# Patient Record
Sex: Female | Born: 1950 | Race: White | Hispanic: No | Smoking: Never smoker
Health system: Southern US, Community
[De-identification: ages and names within clinical notes are randomized; demographics above are authoritative.]

## PROBLEM LIST (undated history)

## (undated) DIAGNOSIS — N393 Stress incontinence (female) (male): Secondary | ICD-10-CM

## (undated) DIAGNOSIS — N390 Urinary tract infection, site not specified: Secondary | ICD-10-CM

## (undated) DIAGNOSIS — F32A Depression, unspecified: Secondary | ICD-10-CM

## (undated) DIAGNOSIS — B009 Herpesviral infection, unspecified: Secondary | ICD-10-CM

## (undated) DIAGNOSIS — N952 Postmenopausal atrophic vaginitis: Secondary | ICD-10-CM

## (undated) DIAGNOSIS — F419 Anxiety disorder, unspecified: Secondary | ICD-10-CM

## (undated) DIAGNOSIS — R7303 Prediabetes: Secondary | ICD-10-CM

## (undated) DIAGNOSIS — I10 Essential (primary) hypertension: Secondary | ICD-10-CM

## (undated) DIAGNOSIS — K635 Polyp of colon: Secondary | ICD-10-CM

## (undated) DIAGNOSIS — N870 Mild cervical dysplasia: Secondary | ICD-10-CM

## (undated) DIAGNOSIS — B977 Papillomavirus as the cause of diseases classified elsewhere: Secondary | ICD-10-CM

## (undated) HISTORY — DX: Depression, unspecified: F32.A

## (undated) HISTORY — DX: Stress incontinence (female) (male): N39.3

## (undated) HISTORY — PX: TUBAL LIGATION: SHX77

## (undated) HISTORY — DX: Papillomavirus as the cause of diseases classified elsewhere: B97.7

## (undated) HISTORY — DX: Postmenopausal atrophic vaginitis: N95.2

## (undated) HISTORY — PX: OPEN REDUCTION INTERNAL FIXATION (ORIF) DISTAL RADIAL FRACTURE: SHX5989

## (undated) HISTORY — DX: Mild cervical dysplasia: N87.0

## (undated) HISTORY — DX: Prediabetes: R73.03

## (undated) HISTORY — DX: Anxiety disorder, unspecified: F41.9

## (undated) HISTORY — PX: ROTATOR CUFF REPAIR: SHX139

## (undated) HISTORY — PX: WRIST SURGERY: SHX841

## (undated) HISTORY — DX: Urinary tract infection, site not specified: N39.0

## (undated) HISTORY — DX: Essential (primary) hypertension: I10

## (undated) HISTORY — DX: Polyp of colon: K63.5

## (undated) HISTORY — DX: Herpesviral infection, unspecified: B00.9

## (undated) HISTORY — PX: COLPOSCOPY: SHX161

---

## 1998-11-25 ENCOUNTER — Other Ambulatory Visit: Admission: RE | Admit: 1998-11-25 | Discharge: 1998-11-25 | Payer: Self-pay | Admitting: Obstetrics and Gynecology

## 1999-12-25 ENCOUNTER — Other Ambulatory Visit: Admission: RE | Admit: 1999-12-25 | Discharge: 1999-12-25 | Payer: Self-pay | Admitting: Obstetrics and Gynecology

## 2000-05-23 ENCOUNTER — Ambulatory Visit (HOSPITAL_COMMUNITY): Admission: RE | Admit: 2000-05-23 | Discharge: 2000-05-23 | Payer: Self-pay | Admitting: *Deleted

## 2000-06-08 ENCOUNTER — Other Ambulatory Visit: Admission: RE | Admit: 2000-06-08 | Discharge: 2000-06-08 | Payer: Self-pay | Admitting: Radiology

## 2000-12-27 ENCOUNTER — Other Ambulatory Visit: Admission: RE | Admit: 2000-12-27 | Discharge: 2000-12-27 | Payer: Self-pay | Admitting: Obstetrics and Gynecology

## 2001-12-25 ENCOUNTER — Ambulatory Visit (HOSPITAL_BASED_OUTPATIENT_CLINIC_OR_DEPARTMENT_OTHER): Admission: RE | Admit: 2001-12-25 | Discharge: 2001-12-25 | Payer: Self-pay | Admitting: *Deleted

## 2002-01-10 ENCOUNTER — Other Ambulatory Visit: Admission: RE | Admit: 2002-01-10 | Discharge: 2002-01-10 | Payer: Self-pay | Admitting: Obstetrics and Gynecology

## 2002-10-24 ENCOUNTER — Ambulatory Visit (HOSPITAL_BASED_OUTPATIENT_CLINIC_OR_DEPARTMENT_OTHER): Admission: RE | Admit: 2002-10-24 | Discharge: 2002-10-24 | Payer: Self-pay | Admitting: *Deleted

## 2003-01-10 ENCOUNTER — Other Ambulatory Visit: Admission: RE | Admit: 2003-01-10 | Discharge: 2003-01-10 | Payer: Self-pay | Admitting: Obstetrics and Gynecology

## 2004-02-12 ENCOUNTER — Other Ambulatory Visit: Admission: RE | Admit: 2004-02-12 | Discharge: 2004-02-12 | Payer: Self-pay | Admitting: Obstetrics and Gynecology

## 2004-12-22 ENCOUNTER — Ambulatory Visit: Payer: Self-pay | Admitting: Family Medicine

## 2005-02-12 ENCOUNTER — Other Ambulatory Visit: Admission: RE | Admit: 2005-02-12 | Discharge: 2005-02-12 | Payer: Self-pay | Admitting: Addiction Medicine

## 2005-08-26 ENCOUNTER — Ambulatory Visit: Payer: Self-pay | Admitting: Internal Medicine

## 2005-10-08 ENCOUNTER — Ambulatory Visit: Payer: Self-pay | Admitting: Internal Medicine

## 2006-02-14 ENCOUNTER — Other Ambulatory Visit: Admission: RE | Admit: 2006-02-14 | Discharge: 2006-02-14 | Payer: Self-pay | Admitting: Obstetrics and Gynecology

## 2007-02-28 ENCOUNTER — Other Ambulatory Visit: Admission: RE | Admit: 2007-02-28 | Discharge: 2007-02-28 | Payer: Self-pay | Admitting: Obstetrics and Gynecology

## 2007-08-21 ENCOUNTER — Telehealth: Payer: Self-pay | Admitting: Internal Medicine

## 2007-10-04 ENCOUNTER — Encounter: Payer: Self-pay | Admitting: Internal Medicine

## 2007-12-14 HISTORY — PX: OTHER SURGICAL HISTORY: SHX169

## 2008-02-19 ENCOUNTER — Encounter: Payer: Self-pay | Admitting: Internal Medicine

## 2008-03-07 ENCOUNTER — Other Ambulatory Visit: Admission: RE | Admit: 2008-03-07 | Discharge: 2008-03-07 | Payer: Self-pay | Admitting: Obstetrics and Gynecology

## 2008-04-03 ENCOUNTER — Encounter: Payer: Self-pay | Admitting: Internal Medicine

## 2008-04-16 DIAGNOSIS — D71 Functional disorders of polymorphonuclear neutrophils: Secondary | ICD-10-CM | POA: Insufficient documentation

## 2008-08-21 ENCOUNTER — Telehealth (INDEPENDENT_AMBULATORY_CARE_PROVIDER_SITE_OTHER): Payer: Self-pay | Admitting: *Deleted

## 2008-10-09 ENCOUNTER — Encounter: Payer: Self-pay | Admitting: Internal Medicine

## 2008-10-10 ENCOUNTER — Ambulatory Visit: Payer: Self-pay | Admitting: Internal Medicine

## 2008-10-19 LAB — CONVERTED CEMR LAB
ALT: 21 units/L (ref 0–35)
AST: 24 units/L (ref 0–37)
Albumin: 3.8 g/dL (ref 3.5–5.2)
Alkaline Phosphatase: 35 units/L — ABNORMAL LOW (ref 39–117)
BUN: 12 mg/dL (ref 6–23)
Basophils Absolute: 0 10*3/uL (ref 0.0–0.1)
Basophils Relative: 0.5 % (ref 0.0–3.0)
Bilirubin, Direct: 0.1 mg/dL (ref 0.0–0.3)
CO2: 27 meq/L (ref 19–32)
Calcium: 8.5 mg/dL (ref 8.4–10.5)
Chloride: 103 meq/L (ref 96–112)
Cholesterol: 169 mg/dL (ref 0–200)
Creatinine, Ser: 0.9 mg/dL (ref 0.4–1.2)
Eosinophils Absolute: 0.3 10*3/uL (ref 0.0–0.7)
Eosinophils Relative: 5.6 % — ABNORMAL HIGH (ref 0.0–5.0)
GFR calc Af Amer: 82 mL/min
GFR calc non Af Amer: 68 mL/min
Glucose, Bld: 81 mg/dL (ref 70–99)
HCT: 35.3 % — ABNORMAL LOW (ref 36.0–46.0)
HDL: 77.4 mg/dL (ref >39.0)
Hemoglobin: 12.2 g/dL (ref 12.0–15.0)
LDL Cholesterol: 78 mg/dL (ref 0–99)
Lymphocytes Relative: 34.3 % (ref 12.0–46.0)
MCHC: 34.5 g/dL (ref 30.0–36.0)
MCV: 86.4 fL (ref 78.0–100.0)
Monocytes Absolute: 0.5 10*3/uL (ref 0.1–1.0)
Monocytes Relative: 11 % (ref 3.0–12.0)
Neutro Abs: 2.2 10*3/uL (ref 1.4–7.7)
Neutrophils Relative %: 48.6 % (ref 43.0–77.0)
Platelets: 185 10*3/uL (ref 150–400)
Potassium: 3.9 meq/L (ref 3.5–5.1)
RBC: 4.09 M/uL (ref 3.87–5.11)
RDW: 12.8 % (ref 11.5–14.6)
Sodium: 137 meq/L (ref 135–145)
TSH: 1.12 microintl units/mL (ref 0.35–5.50)
Total Bilirubin: 0.8 mg/dL (ref 0.3–1.2)
Total CHOL/HDL Ratio: 2.2
Total Protein: 7.1 g/dL (ref 6.0–8.3)
Triglycerides: 69 mg/dL (ref 0–149)
VLDL: 14 mg/dL (ref 0–40)
WBC: 4.6 10*3/uL (ref 4.5–10.5)

## 2008-10-21 ENCOUNTER — Encounter (INDEPENDENT_AMBULATORY_CARE_PROVIDER_SITE_OTHER): Payer: Self-pay | Admitting: *Deleted

## 2008-10-31 ENCOUNTER — Telehealth (INDEPENDENT_AMBULATORY_CARE_PROVIDER_SITE_OTHER): Payer: Self-pay | Admitting: *Deleted

## 2008-11-28 ENCOUNTER — Ambulatory Visit: Payer: Self-pay | Admitting: Internal Medicine

## 2008-11-29 ENCOUNTER — Encounter: Payer: Self-pay | Admitting: Internal Medicine

## 2008-11-30 LAB — CONVERTED CEMR LAB
Basophils Absolute: 0 10*3/uL (ref 0.0–0.1)
Basophils Relative: 0.3 % (ref 0.0–3.0)
Eosinophils Absolute: 0.3 10*3/uL (ref 0.0–0.7)
Eosinophils Relative: 4.6 % (ref 0.0–5.0)
HCT: 36.3 % (ref 36.0–46.0)
Hemoglobin: 11.9 g/dL — ABNORMAL LOW (ref 12.0–15.0)
Lymphocytes Relative: 38.1 % (ref 12.0–46.0)
MCHC: 32.9 g/dL (ref 30.0–36.0)
MCV: 89 fL (ref 78.0–100.0)
Monocytes Absolute: 0.3 10*3/uL (ref 0.1–1.0)
Monocytes Relative: 5.4 % (ref 3.0–12.0)
Neutro Abs: 2.8 10*3/uL (ref 1.4–7.7)
Neutrophils Relative %: 51.6 % (ref 43.0–77.0)
Platelets: 199 10*3/uL (ref 150–400)
RBC: 4.08 M/uL (ref 3.87–5.11)
RDW: 12.9 % (ref 11.5–14.6)
WBC: 5.5 10*3/uL (ref 4.5–10.5)

## 2008-12-02 ENCOUNTER — Encounter: Payer: Self-pay | Admitting: Internal Medicine

## 2008-12-02 ENCOUNTER — Encounter (INDEPENDENT_AMBULATORY_CARE_PROVIDER_SITE_OTHER): Payer: Self-pay | Admitting: *Deleted

## 2008-12-03 LAB — CONVERTED CEMR LAB: Vit D, 1,25-Dihydroxy: 39 (ref 30–89)

## 2009-03-05 ENCOUNTER — Encounter: Payer: Self-pay | Admitting: Internal Medicine

## 2009-03-19 ENCOUNTER — Ambulatory Visit: Payer: Self-pay | Admitting: Obstetrics and Gynecology

## 2009-03-19 ENCOUNTER — Other Ambulatory Visit: Admission: RE | Admit: 2009-03-19 | Discharge: 2009-03-19 | Payer: Self-pay | Admitting: Obstetrics and Gynecology

## 2009-03-19 ENCOUNTER — Encounter: Payer: Self-pay | Admitting: Obstetrics and Gynecology

## 2009-07-08 ENCOUNTER — Telehealth (INDEPENDENT_AMBULATORY_CARE_PROVIDER_SITE_OTHER): Payer: Self-pay | Admitting: *Deleted

## 2009-07-09 ENCOUNTER — Ambulatory Visit: Payer: Self-pay | Admitting: Internal Medicine

## 2009-07-09 ENCOUNTER — Telehealth (INDEPENDENT_AMBULATORY_CARE_PROVIDER_SITE_OTHER): Payer: Self-pay | Admitting: *Deleted

## 2009-07-09 DIAGNOSIS — Z860100 Personal history of colon polyps, unspecified: Secondary | ICD-10-CM | POA: Insufficient documentation

## 2009-07-09 LAB — CONVERTED CEMR LAB
Basophils Absolute: 0.1 10*3/uL (ref 0.0–0.1)
Basophils Relative: 0.8 % (ref 0.0–3.0)
Eosinophils Absolute: 0.1 10*3/uL (ref 0.0–0.7)
Eosinophils Relative: 1.6 % (ref 0.0–5.0)
HCT: 36.2 % (ref 36.0–46.0)
Hemoglobin: 12.4 g/dL (ref 12.0–15.0)
Lymphocytes Relative: 21.6 % (ref 12.0–46.0)
Lymphs Abs: 1.6 10*3/uL (ref 0.7–4.0)
MCHC: 34.3 g/dL (ref 30.0–36.0)
MCV: 89.9 fL (ref 78.0–100.0)
Monocytes Absolute: 0.7 10*3/uL (ref 0.1–1.0)
Monocytes Relative: 9 % (ref 3.0–12.0)
Neutro Abs: 4.9 10*3/uL (ref 1.4–7.7)
Neutrophils Relative %: 67 % (ref 43.0–77.0)
Platelets: 224 10*3/uL (ref 150.0–400.0)
RBC: 4.03 M/uL (ref 3.87–5.11)
RDW: 12.7 % (ref 11.5–14.6)
WBC: 7.4 10*3/uL (ref 4.5–10.5)

## 2009-07-10 ENCOUNTER — Encounter (INDEPENDENT_AMBULATORY_CARE_PROVIDER_SITE_OTHER): Payer: Self-pay | Admitting: *Deleted

## 2009-08-27 ENCOUNTER — Ambulatory Visit: Payer: Self-pay | Admitting: Obstetrics and Gynecology

## 2009-09-29 ENCOUNTER — Telehealth (INDEPENDENT_AMBULATORY_CARE_PROVIDER_SITE_OTHER): Payer: Self-pay | Admitting: *Deleted

## 2009-09-29 ENCOUNTER — Encounter: Payer: Self-pay | Admitting: Internal Medicine

## 2009-09-29 ENCOUNTER — Ambulatory Visit: Payer: Self-pay | Admitting: Family Medicine

## 2009-09-29 DIAGNOSIS — R0789 Other chest pain: Secondary | ICD-10-CM | POA: Insufficient documentation

## 2009-09-29 LAB — CONVERTED CEMR LAB
CK-MB: 2.1 ng/mL (ref 0.3–4.0)
Relative Index: 1.4 (ref 0.0–2.5)
Total CK: 145 units/L (ref 7–177)
Troponin I: 0.01 ng/mL (ref <0.06)

## 2009-11-05 ENCOUNTER — Telehealth (INDEPENDENT_AMBULATORY_CARE_PROVIDER_SITE_OTHER): Payer: Self-pay | Admitting: *Deleted

## 2009-11-21 ENCOUNTER — Telehealth (INDEPENDENT_AMBULATORY_CARE_PROVIDER_SITE_OTHER): Payer: Self-pay | Admitting: *Deleted

## 2009-11-21 DIAGNOSIS — Z780 Asymptomatic menopausal state: Secondary | ICD-10-CM | POA: Insufficient documentation

## 2009-12-08 ENCOUNTER — Encounter: Payer: Self-pay | Admitting: Internal Medicine

## 2009-12-09 ENCOUNTER — Telehealth: Payer: Self-pay | Admitting: Internal Medicine

## 2009-12-09 ENCOUNTER — Ambulatory Visit: Payer: Self-pay | Admitting: Internal Medicine

## 2009-12-09 DIAGNOSIS — IMO0001 Reserved for inherently not codable concepts without codable children: Secondary | ICD-10-CM | POA: Insufficient documentation

## 2009-12-10 LAB — CONVERTED CEMR LAB
Basophils Absolute: 0.1 10*3/uL (ref 0.0–0.1)
Basophils Relative: 3.3 % — ABNORMAL HIGH (ref 0.0–3.0)
Eosinophils Absolute: 0.1 10*3/uL (ref 0.0–0.7)
Eosinophils Relative: 2.7 % (ref 0.0–5.0)
HCT: 35.7 % — ABNORMAL LOW (ref 36.0–46.0)
Hemoglobin: 11.6 g/dL — ABNORMAL LOW (ref 12.0–15.0)
Lymphocytes Relative: 46.7 % — ABNORMAL HIGH (ref 12.0–46.0)
Lymphs Abs: 2 10*3/uL (ref 0.7–4.0)
MCHC: 32.4 g/dL (ref 30.0–36.0)
MCV: 92.2 fL (ref 78.0–100.0)
Monocytes Absolute: 0.9 10*3/uL (ref 0.1–1.0)
Monocytes Relative: 21.8 % — ABNORMAL HIGH (ref 3.0–12.0)
Neutro Abs: 1.1 10*3/uL — ABNORMAL LOW (ref 1.4–7.7)
Neutrophils Relative %: 25.5 % — ABNORMAL LOW (ref 43.0–77.0)
Platelets: 201 10*3/uL (ref 150.0–400.0)
RBC: 3.87 M/uL (ref 3.87–5.11)
RDW: 12.5 % (ref 11.5–14.6)
WBC: 4.2 10*3/uL — ABNORMAL LOW (ref 4.5–10.5)

## 2009-12-11 ENCOUNTER — Telehealth (INDEPENDENT_AMBULATORY_CARE_PROVIDER_SITE_OTHER): Payer: Self-pay | Admitting: *Deleted

## 2009-12-11 ENCOUNTER — Encounter (INDEPENDENT_AMBULATORY_CARE_PROVIDER_SITE_OTHER): Payer: Self-pay | Admitting: *Deleted

## 2010-01-09 ENCOUNTER — Ambulatory Visit: Payer: Self-pay | Admitting: Internal Medicine

## 2010-01-12 LAB — CONVERTED CEMR LAB
Basophils Absolute: 0 10*3/uL (ref 0.0–0.1)
Basophils Relative: 0.3 % (ref 0.0–3.0)
Eosinophils Absolute: 0.1 10*3/uL (ref 0.0–0.7)
Eosinophils Relative: 1.7 % (ref 0.0–5.0)
Folate: >20 ng/mL
HCT: 35.9 % — ABNORMAL LOW (ref 36.0–46.0)
Hemoglobin: 12.1 g/dL (ref 12.0–15.0)
Iron: 190 ug/dL — ABNORMAL HIGH (ref 42–145)
Lymphocytes Relative: 25.6 % (ref 12.0–46.0)
Lymphs Abs: 1.9 10*3/uL (ref 0.7–4.0)
MCHC: 33.6 g/dL (ref 30.0–36.0)
MCV: 90.9 fL (ref 78.0–100.0)
Monocytes Absolute: 0.6 10*3/uL (ref 0.1–1.0)
Monocytes Relative: 7.8 % (ref 3.0–12.0)
Neutro Abs: 4.7 10*3/uL (ref 1.4–7.7)
Neutrophils Relative %: 64.6 % (ref 43.0–77.0)
Platelets: 208 10*3/uL (ref 150.0–400.0)
RBC: 3.95 M/uL (ref 3.87–5.11)
RDW: 13.3 % (ref 11.5–14.6)
Saturation Ratios: 51.4 % — ABNORMAL HIGH (ref 20.0–50.0)
Transferrin: 264.2 mg/dL (ref 212.0–360.0)
Vitamin B-12: 610 pg/mL (ref 211–911)
WBC: 7.3 10*3/uL (ref 4.5–10.5)

## 2010-01-23 ENCOUNTER — Ambulatory Visit: Payer: Self-pay | Admitting: Obstetrics and Gynecology

## 2010-02-09 ENCOUNTER — Ambulatory Visit: Payer: Self-pay | Admitting: Obstetrics and Gynecology

## 2010-03-11 ENCOUNTER — Encounter: Payer: Self-pay | Admitting: Internal Medicine

## 2010-03-13 ENCOUNTER — Encounter: Payer: Self-pay | Admitting: Family Medicine

## 2010-03-16 ENCOUNTER — Encounter: Payer: Self-pay | Admitting: Internal Medicine

## 2010-04-01 ENCOUNTER — Other Ambulatory Visit: Admission: RE | Admit: 2010-04-01 | Discharge: 2010-04-01 | Payer: Self-pay | Admitting: Obstetrics and Gynecology

## 2010-04-01 ENCOUNTER — Ambulatory Visit: Payer: Self-pay | Admitting: Obstetrics and Gynecology

## 2010-05-13 DIAGNOSIS — B977 Papillomavirus as the cause of diseases classified elsewhere: Secondary | ICD-10-CM

## 2010-05-13 DIAGNOSIS — N870 Mild cervical dysplasia: Secondary | ICD-10-CM

## 2010-05-13 HISTORY — DX: Papillomavirus as the cause of diseases classified elsewhere: B97.7

## 2010-05-13 HISTORY — DX: Mild cervical dysplasia: N87.0

## 2010-05-19 ENCOUNTER — Ambulatory Visit: Payer: Self-pay | Admitting: Obstetrics and Gynecology

## 2010-06-22 ENCOUNTER — Ambulatory Visit: Payer: Self-pay | Admitting: Internal Medicine

## 2010-09-07 ENCOUNTER — Encounter: Payer: Self-pay | Admitting: Internal Medicine

## 2010-09-16 ENCOUNTER — Encounter: Payer: Self-pay | Admitting: Internal Medicine

## 2010-10-05 ENCOUNTER — Ambulatory Visit: Payer: Self-pay | Admitting: Internal Medicine

## 2010-11-02 ENCOUNTER — Telehealth: Payer: Self-pay | Admitting: Internal Medicine

## 2010-11-02 ENCOUNTER — Ambulatory Visit: Payer: Self-pay | Admitting: Internal Medicine

## 2010-11-19 ENCOUNTER — Ambulatory Visit: Payer: Self-pay | Admitting: Obstetrics and Gynecology

## 2010-11-19 ENCOUNTER — Other Ambulatory Visit
Admission: RE | Admit: 2010-11-19 | Discharge: 2010-11-19 | Payer: Self-pay | Source: Home / Self Care | Admitting: Obstetrics and Gynecology

## 2010-12-02 ENCOUNTER — Ambulatory Visit: Payer: Self-pay | Admitting: Internal Medicine

## 2010-12-02 DIAGNOSIS — M2550 Pain in unspecified joint: Secondary | ICD-10-CM | POA: Insufficient documentation

## 2010-12-02 DIAGNOSIS — G479 Sleep disorder, unspecified: Secondary | ICD-10-CM | POA: Insufficient documentation

## 2010-12-03 ENCOUNTER — Telehealth: Payer: Self-pay | Admitting: Internal Medicine

## 2010-12-09 ENCOUNTER — Encounter: Payer: Self-pay | Admitting: Internal Medicine

## 2010-12-09 ENCOUNTER — Telehealth: Payer: Self-pay | Admitting: Internal Medicine

## 2010-12-09 ENCOUNTER — Ambulatory Visit
Admission: RE | Admit: 2010-12-09 | Discharge: 2010-12-09 | Payer: Self-pay | Source: Home / Self Care | Attending: Internal Medicine | Admitting: Internal Medicine

## 2010-12-09 ENCOUNTER — Ambulatory Visit: Payer: Self-pay | Admitting: Internal Medicine

## 2010-12-10 LAB — CONVERTED CEMR LAB
ALT: 17 units/L (ref 0–35)
AST: 22 units/L (ref 0–37)
Albumin: 3.5 g/dL (ref 3.5–5.2)
Alkaline Phosphatase: 46 units/L (ref 39–117)
BUN: 13 mg/dL (ref 6–23)
Basophils Absolute: 0 10*3/uL (ref 0.0–0.1)
Basophils Relative: 0.9 % (ref 0.0–3.0)
Bilirubin, Direct: 0 mg/dL (ref 0.0–0.3)
CO2: 27 meq/L (ref 19–32)
CRP, High Sensitivity: 6.93 — ABNORMAL HIGH (ref 0.00–5.00)
Calcium: 8.7 mg/dL (ref 8.4–10.5)
Chloride: 103 meq/L (ref 96–112)
Cholesterol: 164 mg/dL (ref 0–200)
Creatinine, Ser: 0.9 mg/dL (ref 0.4–1.2)
Eosinophils Absolute: 0.4 10*3/uL (ref 0.0–0.7)
Eosinophils Relative: 7.8 % — ABNORMAL HIGH (ref 0.0–5.0)
GFR calc non Af Amer: 69.34 mL/min (ref >60.00)
Glucose, Bld: 83 mg/dL (ref 70–99)
HCT: 35.4 % — ABNORMAL LOW (ref 36.0–46.0)
HDL: 70.6 mg/dL (ref >39.00)
Hemoglobin: 11.9 g/dL — ABNORMAL LOW (ref 12.0–15.0)
LDL Cholesterol: 82 mg/dL (ref 0–99)
Lymphocytes Relative: 30.6 % (ref 12.0–46.0)
Lymphs Abs: 1.7 10*3/uL (ref 0.7–4.0)
MCHC: 33.7 g/dL (ref 30.0–36.0)
MCV: 91.2 fL (ref 78.0–100.0)
Monocytes Absolute: 0.7 10*3/uL (ref 0.1–1.0)
Monocytes Relative: 11.9 % (ref 3.0–12.0)
Neutro Abs: 2.7 10*3/uL (ref 1.4–7.7)
Neutrophils Relative %: 48.8 % (ref 43.0–77.0)
Platelets: 221 10*3/uL (ref 150.0–400.0)
Potassium: 3.9 meq/L (ref 3.5–5.1)
RBC: 3.89 M/uL (ref 3.87–5.11)
RDW: 13.6 % (ref 11.5–14.6)
Sed Rate: 12 mm/hr (ref 0–22)
Sodium: 137 meq/L (ref 135–145)
TSH: 1.29 microintl units/mL (ref 0.35–5.50)
Total Bilirubin: 0.2 mg/dL — ABNORMAL LOW (ref 0.3–1.2)
Total CHOL/HDL Ratio: 2
Total Protein: 7 g/dL (ref 6.0–8.3)
Triglycerides: 59 mg/dL (ref 0.0–149.0)
VLDL: 11.8 mg/dL (ref 0.0–40.0)
Vit D, 25-Hydroxy: 59 ng/mL (ref 30–89)
WBC: 5.4 10*3/uL (ref 4.5–10.5)

## 2011-01-12 NOTE — Progress Notes (Signed)
Summary: TRIAGE MESSAGE-RE RECENT LABS  Phone Note Call from Patient   Caller: Patient Summary of Call: MESSAGE LEFT ON VOICEMAIL-PATIENT RECIEVED LABS AND NOTICED THAT IT SAID SOMETHING ABOUT LOW RBC BUT IT WAS ONLY ABOUT 7% LOWER THAN REF RANGE. PATIENT WAS INSTRUCTED TO RECHECK-APPOINTMENT CARD WAS MAILED. PATIENT STATED THAT SHE DOESNT EAT ANY RED MEATS AND WOULD LIKE TO KNOW IF THATS RELATED TO ABNORMAL RBC AND IF SO SHOULD SHE STILL RECHECK?  [facility] PLEASE ADVISE  Rachel Shepherd  October 31, 2008 11:45 AM   Follow-up for Phone Call        this is not significant ; if she is concerned a CBC & iron panel can be repeated Follow-up by: Marga Melnick MD,  November 01, 2008 6:49 AM  Additional Follow-up for Phone Call Additional follow up Details #1::        SPOKE WITH PATIENT, PATIENT OK'D DR.HOPPER;S RESPONSE. PATIENT HAS PENDING APPOINTMENT TO RECHECK LABS. PATIENT SAID SHE IS ABOUT TO GO ON VACATION X 2 WEEKS AND WILL COMPLETE STOOL CARDS WHEN SHE RETURNS. NOTED.  Additional Follow-up by: Shonna Chock,  November 01, 2008 11:38 AM

## 2011-01-12 NOTE — Assessment & Plan Note (Signed)
Summary: FLU SHOT///SPH  Nurse Visit  CC: Flu shot./kb   Allergies: 1)  ! Amoxicillin 2)  ! Clindamycin 3)  ! Doxycycline  Orders Added: 1)  Admin 1st Vaccine [90471] 2)  Flu Vaccine 36yrs + [57846]           Flu Vaccine Consent Questions     Do you have a history of severe allergic reactions to this vaccine? no    Any prior history of allergic reactions to egg and/or gelatin? no    Do you have a sensitivity to the preservative Thimersol? no    Do you have a past history of Guillan-Barre Syndrome? no    Do you currently have an acute febrile illness? no    Have you ever had a severe reaction to latex? no    Vaccine information given and explained to patient? yes    Are you currently pregnant? no    Lot Number:[id number]   Exp Date:06/12/2011   Site Given  Left  Deltoid IM

## 2011-01-12 NOTE — Progress Notes (Signed)
Summary: Alternative rx  Phone Note Refill Request Message from:  Pharmacy on November 02, 2010 2:36 PM  Refills Requested: Medication #1:  CODEINE SULFATE 15 MG TABS 1 pill every 6 hrs as needed for cough. [facility] [address]. This med is not available and they do not know when they can get it, requesting alternative. Please advise.  Initial call taken by: Lucious Groves CMA,  November 02, 2010 2:36 PM  Follow-up for Phone Call        Spoke with pharmacy, and 30mg  is not available either. They offered to order it for the patient and per pharmacy she did not seem too happy about that.  Whil I held the pharmacy called her and the patient will be leaving town tomorrow. Per MD the patient was given Liquid Tylenol with Codeine Sig: 1 tsp q 6h as needed severe cough 120cc no refills. Follow-up by: Lucious Groves CMA,  November 02, 2010 3:31 PM    New/Updated Medications: TYLENOL WITH CODEINE #3 300-30 MG TABS (ACETAMINOPHEN-CODEINE) 1 tsp q 6h as needed severe cough Prescriptions: TYLENOL WITH CODEINE #3 300-30 MG TABS (ACETAMINOPHEN-CODEINE) 1 tsp q 6h as needed severe cough  #120cc x 0   Entered by:   Lucious Groves CMA   Authorized by:   Marga Melnick MD   Signed by:   Lucious Groves CMA on 11/02/2010   Method used:   Historical   RxID:   [id number]

## 2011-01-12 NOTE — Progress Notes (Signed)
Summary: TRIAGE CALL: BLOOD IN STOOL  Phone Note Call from Patient Call back at [phone]   Caller: Patient Summary of Call: PATIENT CALLED WITH BLOOD IN STOOL, NOTICED TODAY WITH BM. PATIENT NOT WITH ANY OTHER SYMPTOMS. PATIENT UNABLE TO COME IN AT THE TIME OF CALL. PATIENT  SCHEDULED APPOINTMENT TO COME IN TOMORROW AT 9:00AM AND EMERGENCY ROOM IF ANY CHANGE IN SYMPTOMS. PATIENT OK'D  DR.HOPPER WAS INFORMED OF THIS CALL AND OK'D INSTRUCTION FOR EMERGENCY ROOM IF ANY CHANGE IN SYMPTOMS   Rachel Shepherd  July 08, 2009 3:24 PM

## 2011-01-12 NOTE — Letter (Signed)
Summary: Results Follow up Letter  [facility]  [address] [place], [place] [address]   Phone: [phone]  Fax: [phone]    10/21/2008 MRN: [id number]  Rachel Shepherd [address] [place], [place]  [address]  Dear [name],  The following are the results of your recent test(s):  Test         Result    Pap Smear:        Normal _____  Not Normal _____ Comments: ______________________________________________________ Cholesterol: LDL(Bad cholesterol):         Your goal is less than:         HDL (Good cholesterol):       Your goal is more than: Comments:  ______________________________________________________ Mammogram:        Normal _____  Not Normal _____ Comments:  ___________________________________________________________________ Hemoccult:        Normal _____  Not normal _______ Comments:    _____________________________________________________________________ Other Tests: Please see attached labs done on 10/10/2008, see attached appointment card to recheck labs in 8-12 weeks. If appointment date/time interfers with your scheduled please call to reschedule.    We routinely do not discuss normal results over the telephone.  If you desire a copy of the results, or you have any questions about this information we can discuss them at your next office visit.   Sincerely,

## 2011-01-12 NOTE — Letter (Signed)
Summary: Results Follow up Letter  [facility]  [address] [place], [place] [address]   Phone: [phone]  Fax: [phone]    07/10/2009 MRN: [id number]  Rachel Shepherd [address] [place], [place]  [address]  Dear Ms. Prosch,  The following are the results of your recent test(s):  Test         Result    Pap Smear:        Normal _____  Not Normal _____ Comments: ______________________________________________________ Cholesterol: LDL(Bad cholesterol):         Your goal is less than:         HDL (Good cholesterol):       Your goal is more than: Comments:  ______________________________________________________ Mammogram:        Normal _____  Not Normal _____ Comments:  ___________________________________________________________________ Hemoccult:        Normal _____  Not normal _______ Comments:    _____________________________________________________________________ Other Tests: PLEASE SEE ATTACHED LABS DONE ON 07/09/2009    We routinely do not discuss normal results over the telephone.  If you desire a copy of the results, or you have any questions about this information we can discuss them at your next office visit.   Sincerely,

## 2011-01-12 NOTE — Assessment & Plan Note (Signed)
Summary: DRY COUGH//PH   Vital Signs:  Patient profile:   60 year old female Height:      63.5 inches (161.29 cm) Weight:      124 pounds (56.36 kg) BMI:     21.70 O2 Sat:      98 % on Room air Temp:     99.2 degrees F (37.33 degrees C) oral Pulse rate:   72 / minute Resp:     14 per minute BP sitting:   120 / 84  (left arm) Cuff size:   regular  Vitals Entered By: Lucious Groves CMA (November 02, 2010 12:23 PM)  O2 Flow:  Room air CC: C/O dry cough x10 days./kb, URI symptoms Is Patient Diabetic? No Pain Assessment Patient in pain? no      Comments Patient notes that she has had the cough x10 days and had a dry cough last year that turned out to be bronchitis. She has has mucous production 4x and brown in color and sore throat. Patient notes that she has not had fever, SOB, or HA./kb   CC:  C/O dry cough x10 days./kb and URI symptoms.  History of Present Illness:  RTI  Symptoms      This is a 60 year old woman who presents with   symptoms since 11/12; onset as ST & then laryngitis.  The patient  now reports sore throat and minimally  productive cough with scant  brown sputum, but denies nasal congestion, purulent nasal discharge, and earache.  Associated symptoms include low-grade fever (<100.5 degrees).  The patient denies dyspnea and wheezing.  The patient denies headache &  bilateral facial pain, tooth pain, and tender adenopathy.  Rx: Mucinex, cold preps  Current Medications (verified): 1)  Calcium 1000 + D 1000-800 Mg-Unit Tabs (Calcium Carb-Cholecalciferol) .Marland Kitchen.. 1 By Mouth Qd 2)  Biotin 5000 5 Mg Caps (Biotin) .Marland Kitchen.. 1 By Mouth Once Daily 3)  Coq10 100 Mg Caps (Coenzyme Q10) .... 300mg  Once Daily 4)  Fish Oil (360mg  Epa-240mg  Dha) .Marland Kitchen.. 1 By Mouth Once Daily 5)  Vitamin C 1000 Mg Tabs (Ascorbic Acid) .Marland Kitchen.. 1 By Mouth Once Daily 6)  Estrace 0.5 Mg Tabs (Estradiol) .Marland Kitchen.. 1 By Mouth Once Daily 7)  Activella 1-0.5 Mg Tabs (Estradiol-Norethindrone Acet) .Marland Kitchen.. 1 By Mouth Once  Daily 8)  Flora Smart .Marland Kitchen.. 1 By Mouth Once Daily 9)  Vitamin D3 2000 Unit Tabs (Cholecalciferol) .Marland Kitchen.. 1 By Mouth Qd 10)  Vitmain B 12 .Marland Kitchen.. 1 By Mouth Qd 11)  Acidophilus  Caps (Lactobacillus) .... Three Times A Day  Allergies (verified): 1)  ! Amoxicillin 2)  ! Clindamycin 3)  ! Doxycycline  Physical Exam  General:  in no acute distress; alert,appropriate and cooperative throughout examination Ears:  External ear exam shows no significant lesions or deformities.  Otoscopic examination reveals clear canals, tympanic membranes are intact bilaterally without bulging, retraction, inflammation or discharge. Hearing is grossly normal bilaterally. Nose:  External nasal examination shows no deformity or inflammation. Nasal mucosa are pink and moist without lesions or exudates. Mouth:  Oral mucosa and oropharynx without lesions or exudates.  Teeth in good repair. Lungs:  Normal respiratory effort, chest expands symmetrically. Lungs are clear to auscultation, no crackles or wheezes. Cervical Nodes:  No lymphadenopathy noted Axillary Nodes:  No palpable lymphadenopathy   Impression & Recommendations:  Problem # 1:  BRONCHITIS-ACUTE (ICD-466.0)  The following medications were removed from the medication list:    Doxycycline Hyclate 100 Mg Caps (Doxycycline hyclate) .Marland KitchenMarland KitchenMarland KitchenMarland Kitchen  1 two times a day x 5 days then 1 once daily Her updated medication list for this problem includes:    Smz-tmp Ds 800-160 Mg Tabs (Sulfamethoxazole-trimethoprim) .Marland Kitchen... 1 two times a day with 8 oz water  Complete Medication List: 1)  Calcium 1000 + D 1000-800 Mg-unit Tabs (Calcium carb-cholecalciferol) .Marland Kitchen.. 1 by mouth qd 2)  Biotin 5000 5 Mg Caps (Biotin) .Marland Kitchen.. 1 by mouth once daily 3)  Coq10 100 Mg Caps (Coenzyme q10) .... 300mg  once daily 4)  Fish Oil (360mg  Epa-240mg  Dha)  .Marland Kitchen.. 1 by mouth once daily 5)  Vitamin C 1000 Mg Tabs (Ascorbic acid) .Marland Kitchen.. 1 by mouth once daily 6)  Estrace 0.5 Mg Tabs (Estradiol) .Marland Kitchen.. 1 by mouth once  daily 7)  Activella 1-0.5 Mg Tabs (Estradiol-norethindrone acet) .Marland Kitchen.. 1 by mouth once daily 8)  [employer]  .Marland KitchenMarland Kitchen. 1 by mouth once daily 9)  Vitamin D3 2000 Unit Tabs (Cholecalciferol) .Marland Kitchen.. 1 by mouth qd 10)  Vitmain B 12  .Marland KitchenMarland Kitchen. 1 by mouth qd 11)  Acidophilus Caps (Lactobacillus) .... Three times a day 12)  Smz-tmp Ds 800-160 Mg Tabs (Sulfamethoxazole-trimethoprim) .Marland Kitchen.. 1 two times a day with 8 oz water 13)  Codeine Sulfate 15 Mg Tabs (Codeine sulfate) .Marland Kitchen.. 1 pill every 6 hrs as needed for cough  Patient Instructions: 1)  Drink as much  NON dairy fluid as you can tolerate for the next few days. Prescriptions: CODEINE SULFATE 15 MG TABS (CODEINE SULFATE) 1 pill every 6 hrs as needed for cough  #20 x 0   Entered and Authorized by:   Marga Melnick MD   Signed by:   Marga Melnick MD on 11/02/2010   Method used:   Print then Give to Patient   RxID:   [id number] SMZ-TMP DS 800-160 MG TABS (SULFAMETHOXAZOLE-TRIMETHOPRIM) 1 two times a day with 8 oz water  #14 x 0   Entered and Authorized by:   Marga Melnick MD   Signed by:   Marga Melnick MD on 11/02/2010   Method used:   Print then Give to Patient   RxID:   [id number]    Orders Added: 1)  Est. Patient Level III [40102]

## 2011-01-12 NOTE — Assessment & Plan Note (Signed)
Summary: schingles vac/kn  Nurse Visit   Allergies: 1)  ! Amoxicillin 2)  ! Clindamycin 3)  ! Doxycycline  Immunizations Administered:  Zostavax # 1:    Vaccine Type: Zostavax    Site: Left Arm    Mfr: Merck    Dose: 0.65.mL    Route: [place]ers    Given by: Shonna Chock    Exp. Date: 07/08/2011    Lot #: [id number]    VIS given: 09/24/05 given June 22, 2010.  Orders Added: 1)  Zoster (Shingles) Vaccine Live [90736] 2)  Admin 1st Vaccine [phone]

## 2011-01-12 NOTE — Progress Notes (Signed)
      Influenza Immunization History:    Influenza # 1:  Fluvax Non-MCR (08/15/2008) GIVEN @ [facility]./Chrae Malloy  August 21, 2008 3:28 PM

## 2011-01-12 NOTE — Progress Notes (Signed)
Summary: referral bone densit- dr Jackeline Gutknecht  Phone Note Call from Patient Call back at Home Phone [phone] Call back at [phone]   Caller: Patient Summary of Call: need referral for bone density -bone density discussed at last ov -  which is scheduled [id number] - fax to [facility]  Initial call taken by: Okey Regal [place],  August 21, 2007 10:43 AM  Follow-up for Phone Call        last office visit 08/26/05 ..................................................................Marland KitchenShary Decamp  August 21, 2007 11:05 AM   Additional Follow-up for Phone Call Additional follow up Details #1::        schedule BMD & request F/u after results back Additional Follow-up by: Marga Melnick MD,  August 21, 2007 6:15 PM

## 2011-01-12 NOTE — Letter (Signed)
Summary: Results Follow up Letter  [facility]  [address] [place], [place] [address]   Phone: [phone]  Fax: [phone]    12/02/2008 MRN: [id number]  NURAH PETRIDES [address] [place], [place]  [address]  Dear Ms. Sumida,  The following are the results of your recent test(s):  Test         Result    Pap Smear:        Normal _____  Not Normal _____ Comments: ______________________________________________________ Cholesterol: LDL(Bad cholesterol):         Your goal is less than:         HDL (Good cholesterol):       Your goal is more than: Comments:  ______________________________________________________ Mammogram:        Normal _____  Not Normal _____ Comments:  ___________________________________________________________________ Hemoccult:        Normal _____  Not normal _______ Comments:    _____________________________________________________________________ Other Tests: Please see attached labs done on 11/29/2008    We routinely do not discuss normal results over the telephone.  If you desire a copy of the results, or you have any questions about this information we can discuss them at your next office visit.   Sincerely,

## 2011-01-12 NOTE — Progress Notes (Signed)
Summary: chest tightness   Phone Note Outgoing Call Call back at [phone]   Call placed by: Doristine Devoid,  September 29, 2009 1:25 PM Call placed to: Patient Summary of Call: call patient home number left msg on machine found alt. # which was cell patient says she isn't having chest pain just doesn't know how to described symptoms started sat. on and off informed patient that if symptoms get worse before appt will need to go to emergency room patient agreed.

## 2011-01-12 NOTE — Assessment & Plan Note (Signed)
Summary: COUGH,RUNNY NOSE/RH.....   Vital Signs:  Patient profile:   60 year old female Weight:      125.6 pounds Temp:     99.3 degrees F oral Pulse rate:   70 / minute Resp:     14 per minute BP sitting:   124 / 80  (left arm) Cuff size:   regular  Vitals Entered By: Shonna Chock (December 09, 2009 3:01 PM) CC: Cough and runny nose x 1 week. Patient would like to know if its ok for her to get shingles vaccine Comments REVIEWED MED LIST, PATIENT AGREED DOSE AND INSTRUCTION CORRECT    CC:  Cough and runny nose x 1 week. Patient would like to know if its ok for her to get shingles vaccine.  History of Present Illness: One week "semi" ST, fatigue , & myalgias. Eyes twitching & vision blurred. Rx: rest, fluids, & Theraflu with some benefit. She has  had flu shot.  Allergies: 1)  ! Amoxicillin 2)  ! Clindamycin  Review of Systems General:  Complains of sweats; denies chills and fever. Eyes:  Denies discharge, eye pain, red eye, and vision loss-both eyes. ENT:  Complains of nasal congestion and sinus pressure; No frontal headache, facial pain or purulence. Resp:  Complains of cough and sputum productive; denies shortness of breath and wheezing; Clear sputum. MS:  Complains of muscle aches; denies joint pain, joint redness, and joint swelling.  Physical Exam  General:  in no acute distress; alert,appropriate and cooperative throughout examination Ears:  External ear exam shows no significant lesions or deformities.  Otoscopic examination reveals clear canals, tympanic membranes are intact bilaterally without bulging, retraction, inflammation or discharge. Hearing is grossly normal bilaterally. Nose:  External nasal examination shows no deformity or inflammation. Nasal mucosa are  dry & erythematous without lesions or exudates. Mouth:  Oral mucosa and oropharynx without lesions or exudates.  Teeth in good repair. Lungs:  Normal respiratory effort, chest expands symmetrically. Lungs  are clear to auscultation, no crackles or wheezes. Cervical Nodes:  No lymphadenopathy noted but tender Axillary Nodes:  No palpable lymphadenopathy   Impression & Recommendations:  Problem # 1:  BRONCHITIS-ACUTE (ICD-466.0)  Her updated medication list for this problem includes:    Doxycycline Hyclate 100 Mg Caps (Doxycycline hyclate) .Marland Kitchen... 1 two times a day x 5 days then 1 once daily  Orders: Venipuncture (52841) TLB-CBC Platelet - w/Differential (85025-CBCD)  Problem # 2:  URI (ICD-465.9)  Problem # 3:  MUSCLE PAIN (ICD-729.1)  Problem # 4:  FATIGUE (ICD-780.79)  Orders: Venipuncture (32440) TLB-CBC Platelet - w/Differential (85025-CBCD)  Complete Medication List: 1)  Calcium-carb 600 + D 600-125 Mg-unit Tabs (Calcium-vitamin d) .Marland Kitchen.. 1 by mouth once daily 2)  Biotin 5000 5 Mg Caps (Biotin) .Marland Kitchen.. 1 by mouth once daily 3)  Coq10 100 Mg Caps (Coenzyme q10) .... 300mg  once daily 4)  Fish Oil (360mg  Epa-240mg  Dha)  .Marland Kitchen.. 1 by mouth once daily 5)  Vitamin C 1000 Mg Tabs (Ascorbic acid) .Marland Kitchen.. 1 by mouth once daily 6)  Estrace 0.5 Mg Tabs (Estradiol) .Marland Kitchen.. 1 by mouth once daily 7)  Activella 1-0.5 Mg Tabs (Estradiol-norethindrone acet) .Marland Kitchen.. 1 by mouth once daily 8)  Vitamin E 200 Unit Caps (Vitamin e) .Marland Kitchen.. 1 by mouth once daily 9)  Flora Smart  .Marland KitchenMarland Kitchen. 1 by mouth once daily 10)  Doxycycline Hyclate 100 Mg Caps (Doxycycline hyclate) .Marland Kitchen.. 1 two times a day x 5 days then 1 once daily  Patient Instructions: 1)  Neti pot once daily as needed for congestion. Zinc lozenges OR Zicam as needed. Vitamin C 2000 mg once daily . Echinea once daily X 5-7 days.  Prescriptions: DOXYCYCLINE HYCLATE 100 MG CAPS (DOXYCYCLINE HYCLATE) 1 two times a day X 5 days then 1 once daily  #15 x 0   Entered and Authorized by:   Marga Melnick MD   Signed by:   Marga Melnick MD on 12/09/2009   Method used:   Faxed to ...       [id number]       [address]       [place], [place]  [address]        Ph: [phone] or [phone]       Fax: [phone]   RxID:   [id number]

## 2011-01-12 NOTE — Progress Notes (Signed)
Summary: Lab Results  Phone Note Outgoing Call Call back at [facility] Phone [phone]   Call placed by: Shonna Chock,  December 11, 2009 9:55 AM Call placed to: Patient Summary of Call: Left message on machine for patient to return call when avaliable, Reason for call:   Low white count & types of cells suggest recent viral process rather than an atypical bacterial process . Hold Doxycycline unless yellow or green  secretions present. Recheck CBC & dif in 1 month with iron panel, B12 & folic acid due to mild anemia(285.9). Report any change or progression in symptoms. [facility]  Chrae Malloy  December 11, 2009 4:17 PM   Follow-up for Phone Call        Spoke with patient: Medication caused patient to have a rash on stomach and chest and its a little itching. Patient also said her eyes are not right- not seeing up close as well, patient will see eye Dr around the first of the year.  Patient aware I will not on her allergy list about the ABX and she can d/c med at this time.  I faxed patient her labs @ [phone] Follow-up by: Shonna Chock,  December 11, 2009 4:57 PM   New Allergies: ! DOXYCYCLINE New Allergies: ! DOXYCYCLINE

## 2011-01-12 NOTE — Letter (Signed)
Summary: Results Follow up Letter  [facility]  [address] [place], [place] [address]   Phone: [phone]  Fax: [phone]    12/11/2009 MRN: [id number]  Rachel Shepherd [address] [place], [place]  [address]  Dear [name],  The following are the results of your recent test(s):  Test         Result    Pap Smear:        Normal _____  Not Normal _____ Comments: ______________________________________________________ Cholesterol: LDL(Bad cholesterol):         Your goal is less than:         HDL (Good cholesterol):       Your goal is more than: Comments:  ______________________________________________________ Mammogram:        Normal _____  Not Normal _____ Comments:  ___________________________________________________________________ Hemoccult:        Normal _____  Not normal _______ Comments:    _____________________________________________________________________ Other Tests: Please see attached labs done on 12/09/2009    We routinely do not discuss normal results over the telephone.  If you desire a copy of the results, or you have any questions about this information we can discuss them at your next office visit.   Sincerely,

## 2011-01-14 NOTE — Progress Notes (Signed)
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 This may be from the shot or due to the extensive weight training yesterday. If not better in a few days patient can be seen at [facility] clinic or follow up with [facility] next week. Patient expressed understanding. Follow-up by: Lucious Groves CMA,  December 03, 2010 3:37 PM  Additional Follow-up for Phone Call Additional follow up Details #1::        she should avoid weight work until soreness resolves Additional Follow-up by: Marga Melnick MD,  December 03, 2010 4:39 PM    Additional Follow-up for Phone Call Additional follow up Details #2::    Patient was aware. Lucious Groves CMA  December 03, 2010 4:48 PM

## 2011-01-14 NOTE — Progress Notes (Signed)
Summary: CXR and chest pain  Phone Note Call from Patient Call back at Home Phone [phone] Call back at [phone]   Summary of Call: Patient called to notify that she went for CXR this AM, but has been having chest pain on/off x1 week. She notes that yesterday the pain was in the center of her sternum. Patient thinks that it is possibly stress or gas related. Patient just wanted to let MD know that way her results could be reviewed ASAP. Initial call taken by: Lucious Groves CMA,  December 09, 2010 9:28 AM  Follow-up for Phone Call        Chest film reveals old scarring from remote infection; NO acute or new changes seen. Copy wil be mailed Follow-up by: Marga Melnick MD,  December 09, 2010 2:23 PM  Additional Follow-up for Phone Call Additional follow up Details #1::        Patient notified of the above and pt is aware that if chest pain continues to go to ER for eval and possible repeat EKG . Lucious Groves CMA  December 09, 2010 2:52 PM

## 2011-01-14 NOTE — Assessment & Plan Note (Signed)
Summary: CPX//PH   Vital Signs:  Patient profile:   60 year old female Height:      63 inches Weight:      126 pounds BMI:     22.40 Temp:     97.9 degrees F oral Pulse rate:   76 / minute Resp:     14 per minute BP sitting:   130 / 76  (left arm) Cuff size:   regular  Vitals Entered By: Shonna Chock CMA (December 02, 2010 2:22 PM) CC: CPX , Insomnia   CC:  CPX  and Insomnia.  History of Present Illness:     Rachel Shepherd is here for a physical;she has had some sleep disruption over past year.  The patient reports early awakening 2-3 am , but denies difficulty falling asleep, nightmares, leg movements, snoring, apnea noted by partner, and daytime somnolence. This has not caused any significant impact on function .   Preventive Screening-Counseling & Management  Alcohol-Tobacco     Smoking Status: never  Current Medications (verified): 1)  Calcium 1000 + D 1000-800 Mg-Unit Tabs (Calcium Carb-Cholecalciferol) .Marland Kitchen.. 1 By Mouth Qd 2)  Biotin 5000 5 Mg Caps (Biotin) .Marland Kitchen.. 1 By Mouth Once Daily 3)  Coq10 100 Mg Caps (Coenzyme Q10) .... 300mg  Once Daily 4)  Fish Oil (360mg  Epa-240mg  Dha) .Marland Kitchen.. 1 By Mouth Once Daily 5)  Vitamin C 1000 Mg Tabs (Ascorbic Acid) .Marland Kitchen.. 1 By Mouth Once Daily 6)  Estrace 0.5 Mg Tabs (Estradiol) .Marland Kitchen.. 1 By Mouth Once Daily 7)  Activella 1-0.5 Mg Tabs (Estradiol-Norethindrone Acet) .Marland Kitchen.. 1 By Mouth Once Daily 8)  Vitamin D3 2000 Unit Tabs (Cholecalciferol) .Marland Kitchen.. 1 By Mouth Qd 9)  Vitmain B 12 .Marland Kitchen.. 1 By Mouth Qd 10)  Acidophilus  Caps (Lactobacillus) .... Three Times A Day 11)  Enzymes .... Daily  Allergies: 1)  ! Amoxicillin 2)  ! Clindamycin 3)  ! 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.1) as per CT chest scan  in 2001 Minimal Anisocoria Colonic polyps,PMH  of, Dr Kinnie Scales  Past Surgical History: Tubal ligation, Dr Eda Paschal WRIST FRACTURE  2003,S/P plate removal  in 2004 Epidermoid inclusion cystectomy  of face ; Colonoscopy 2004 negative , Dr Kinnie Scales Colon polypectomy 12/10/2009, due 2015  Family History: Mother: asthma, breast cancer PGM: CVA; Father:d MVA @42 ; P aunt : breast cancer  Social History: Never Smoked Alcohol use-yes: socially Regular exercise-yes: CVE, yoga & weights 5 days/ week Occupation: Chief Financial Officer / [employer]  Review of Systems       Weight up 5#. Bronchitis X 2 in past year MS:  Complains of joint pain; denies joint redness, joint swelling, muscle aches, and muscle weakness.  Physical Exam  General:  well-nourished,alert,appropriate and cooperative throughout examination Head:  Normocephalic and atraumatic without obvious abnormalities. Eyes:  No corneal or conjunctival inflammation noted.Perrla. Funduscopic exam benign, without hemorrhages, exudates or papilledema. Ears:  External ear exam shows no significant lesions or deformities.  Otoscopic examination reveals clear canals, tympanic membranes are intact bilaterally without bulging, retraction, inflammation or discharge. Hearing is grossly normal bilaterally. Nose:  External nasal examination shows no deformity or inflammation. Nasal mucosa are pink and moist without lesions or exudates. Mouth:  Oral mucosa and oropharynx without lesions or exudates.  Teeth in good repair. Neck:  No deformities, masses, or tenderness noted. Lungs:  Normal respiratory effort, chest expands symmetrically. Lungs are clear to auscultation, no crackles or wheezes. Heart:  Normal rate and regular rhythm. S1 and  S2 normal without gallop, murmur, click, rub.S4  Abdomen:  Bowel sounds positive,abdomen soft and non-tender without masses, organomegaly or hernias noted. Aorta palpable w/o AAA Genitalia:  Dr Eda Paschal Msk:  No deformity or scoliosis noted of thoracic or lumbar spine.   Pulses:  R and L carotid,radial,dorsalis pedis and posterior tibial pulses are full and equal bilaterally Extremities:  No clubbing, cyanosis, edema, or  deformity noted with normal full range of motion of all joints.   Neurologic:  alert & oriented X3 and DTRs symmetrical and normal.   Skin:  Intact without suspicious lesions or rashes Cervical Nodes:  No lymphadenopathy noted Axillary Nodes:  No palpable lymphadenopathy Psych:  memory intact for recent and remote, normally interactive, and good eye contact.     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 Sleep hygiene discussed. Intervention if symptoms progress.  Complete Medication List: 1)  Calcium 1000 + D 1000-800 Mg-unit Tabs (Calcium carb-cholecalciferol) .Marland Kitchen.. 1 by mouth qd 2)  Biotin 5000 5 Mg Caps (Biotin) .Marland Kitchen.. 1 by mouth once daily 3)  Coq10 100 Mg Caps (Coenzyme q10) .... 300mg  once daily 4)  Fish Oil (360mg  Epa-240mg  Dha)  .Marland Kitchen.. 1 by mouth once daily 5)  Vitamin C 1000 Mg Tabs (Ascorbic acid) .Marland Kitchen.. 1 by mouth once daily 6)  Estrace 0.5 Mg Tabs (Estradiol) .Marland Kitchen.. 1 by mouth once daily 7)  Activella 1-0.5 Mg Tabs (Estradiol-norethindrone acet) .Marland Kitchen.. 1 by mouth once daily 8)  Vitamin D3 2000 Unit Tabs (Cholecalciferol) .Marland Kitchen.. 1 by mouth qd 9)  Vitmain B 12  .Marland KitchenMarland Kitchen. 1 by mouth qd 10)  Acidophilus Caps (Lactobacillus) .... Three times a day 11)  Enzymes  .... Daily  Other Orders: Pneumococcal Vaccine (16109) Admin 1st Vaccine (60454)  Patient Instructions: 1)  Schedule fasting  labs @ [address] [facility]:C- reactive protein; sed rate;vitamin D level  ; 2)  BMP ; 3)  Hepatic Panel; 4)  Lipid Panel ; 5)  TSH; 6)  CBC w/ Diff.   Orders Added: 1)  Est. Patient 40-64 years [99396] 2)  EKG w/  Interpretation [93000] 3)  T-2 View CXR [71020TC] 4)  Pneumococcal Vaccine [90732] 5)  Admin 1st Vaccine [09811]   Immunizations Administered:  Pneumonia Vaccine:    Vaccine Type: Pneumovax    Site: right deltoid    Mfr: Merck    Dose: 0.5 ml    Route: IM    Given by: Shonna Chock CMA    Exp. Date: 04/13/2012    Lot #: [id number]   Immunizations Administered:  Pneumonia Vaccine:    Vaccine Type: Pneumovax    Site: right deltoid    Mfr: Merck    Dose: 0.5 ml    Route: IM    Given by: Shonna Chock CMA    Exp. Date: 04/13/2012    Lot #: [id number]

## 2011-03-15 ENCOUNTER — Encounter: Payer: Self-pay | Admitting: Internal Medicine

## 2011-04-12 ENCOUNTER — Encounter (INDEPENDENT_AMBULATORY_CARE_PROVIDER_SITE_OTHER): Payer: BC Managed Care – PPO | Admitting: Obstetrics and Gynecology

## 2011-04-12 ENCOUNTER — Other Ambulatory Visit (HOSPITAL_COMMUNITY)
Admission: RE | Admit: 2011-04-12 | Discharge: 2011-04-12 | Disposition: A | Discharge status: Home / Self Care | Payer: BC Managed Care – PPO | Source: Ambulatory Visit | Attending: Obstetrics and Gynecology | Admitting: Obstetrics and Gynecology

## 2011-04-12 ENCOUNTER — Other Ambulatory Visit: Payer: Self-pay | Admitting: Obstetrics and Gynecology

## 2011-04-12 DIAGNOSIS — R87619 Unspecified abnormal cytological findings in specimens from cervix uteri: Secondary | ICD-10-CM | POA: Insufficient documentation

## 2011-04-12 DIAGNOSIS — Z01419 Encounter for gynecological examination (general) (routine) without abnormal findings: Secondary | ICD-10-CM

## 2011-04-30 NOTE — Op Note (Signed)
   NAMEARMANI, Rachel Shepherd                           ACCOUNT NO.:  [id number]   MEDICAL RECORD NO.:  [id number]                   PATIENT TYPE:  AMB   LOCATION:  DSC                                  FACILITY:  [facility]   PHYSICIAN:  Lowell Bouton, M.D.      DATE OF BIRTH:  04-Jan-1951   DATE OF PROCEDURE:  10/24/2002  DATE OF DISCHARGE:                                 OPERATIVE REPORT   PREOPERATIVE DIAGNOSIS:  Status post open reduction internal fixation, left  distal radius fracture.   POSTOPERATIVE DIAGNOSIS:  Status post open reduction internal fixation, left  distal radius fracture.   PROCEDURE:  Removal of plate and screws, left distal radius.   SURGEON:  Lowell Bouton, M.D.   ANESTHESIA:  General.   OPERATIVE FINDINGS:  The patient had a healed fracture with no reaction  around the plate.   PROCEDURE:  Under general anesthesia with a tourniquet on the left arm, the  left hand was prepped and draped in the usual fashion after exsanguinating  the limb, the tourniquet was inflated to 225 mmHg.  The previous incision  was opened volarly along the radial side of the wrist.  Sharp dissection was  carried through the subcutaneous tissues and bleeding points were  coagulated.  Blunt dissection was carried down to the FCR sheath which was  incised longitudinally.  The FCR was then retracted ulnarly and the floor of  the sheath was incised.  The FTL tendon was then retracted radially and  blunt dissection was carried down to the pronator quadratus.  This was  sharply divided longitudinally off of its attachment to the radius leaving a  cuff of tissue for later repair.  A Freer elevator was used to elevate the  tissue off of the plate, and a knife was used to remove any scar tissue.  The plate was then removed including three large screws and four pegs.  The  wound was irrigated copiously with saline.  The pronator quadratus was  reattached with a 4-0 Mersilene.   Subcutaneous tissue was closed over a  vessel loop drain using 4-0 Vicryl and a 3-0 subcuticular Prolene in the  skin.  Steri-Strips were applied, followed by sterile dressings and a volar  wrist splint.  The patient tolerated the procedure well, went to the  recovery room awake in stable and good condition.                                               Lowell Bouton, M.D.    EMM/MEDQ  D:  10/24/2002  T:  10/25/2002  Job:  [id number]

## 2011-04-30 NOTE — Op Note (Signed)
[facility]  Patient:    Rachel Shepherd, Rachel Shepherd Visit Number: [id number] MRN: [id number]          Service Type: DSU Location: [facility] Attending Physician:  Kendell Bane Dictated by:   Lowell Bouton, M.D. Proc. Date: 12/25/01 Admit Date:  12/25/2001   CC:         Titus Dubin. Alwyn Ren, M.D. [facility]   Operative Report  PREOPERATIVE DIAGNOSIS:  Fracture, left distal radius and ulnar styloid.  POSTOPERATIVE DIAGNOSIS:  Fracture, left distal radius and ulnar styloid.  OPERATION PERFORMED:  Open reduction internal fixation, left distal radius.  SURGEON:  Lowell Bouton, M.D.  ANESTHESIA:  General.  INDICATIONS FOR PROCEDURE:  The patient had an oblique distal radius fracture that had an extension into the radial carpal joint that was not displaced. The fracture was angulated dorsally and displaced somewhat radially.  DESCRIPTION OF PROCEDURE:  Under general anesthesia with a tourniquet on the left arm, the left hand was prepped and draped in the usual fashion and after exsanguinating the limb, the tourniquet was inflated to 250 mmHg.  A longitudinal incision was made along the flexor carpi radialis tendon volarly. The distal end of the incision was carried obliquely across the proximal wrist crease.  Sharp dissection was carried through the subcutaneous tissues and bleeding point were coagulated.  Blunt dissection was carried down to the flexor carpi radialis tendon and the sheath was incised longitudinally.  The tendon was then retracted ulnarly and the floor of the sheath was opened with the scissors.  The flexor pollicis longus tendon was then released proximally off of the radius.  The Weitlaner retractor was then inserted, bringing the flexor pollicis longus tendon ulnarly.  The pronator quadratus was elevated off of the distal radius sharply on the radial aspect.  The pronator was then retracted ulnarly.  The fracture site  was then identified and there were some early signs of healing.  The index and middle fingers were then placed in finger trap traction with 10 pounds of traction across the fracture site over the end of the table.  The fracture was freed up with a Therapist, nutritional and the fracture was reduced.  The traction was then removed from the fingers and the reduction was held manually.  A DVR plate was then used volarly using a left-sided plate.  It was positioned on the distal radius and x-rays were obtained, showing good alignment.  The 3.5 mm screw in the slotted hole was inserted first.  The plate was then adjusted and x-ray showed acceptable alignment.  The distal pegs were then inserted using 2.0 nonthreaded pegs, placing four in the distal fragment.  The two remaining 3.5 mm screws were placed proximally and the proximal hole was left open.  There was good bone density and good fixation of the fracture.  X-rays including fluoroscopy showed no movement at the fracture site.  The wound was then irrigated with saline.  The pronator was reattached with 4-0 Vicryl suture.  Subcutaneous tissues were closed over vessel loop drain with 4-0 Vicryl suture.  The skin was closed with a 3-0 subcuticular Prolene.  Steri-Strips were applied followed by sterile dressings and a sugar-tong splint.  The tourniquet was released with good circulation to the hand.  The patient went to the recovery room awake and stable in good condition. Dictated by:   Lowell Bouton, M.D. Attending Physician:  Kendell Bane DD:  12/25/01 TD:  12/25/01 Job: [id number]

## 2011-08-24 ENCOUNTER — Other Ambulatory Visit: Payer: Self-pay | Admitting: *Deleted

## 2011-08-24 MED ORDER — ESTROGENS CONJUGATED 0.3 MG PO TABS: 0.3000 mg | ORAL_TABLET | Freq: Every day | ORAL | Status: DC

## 2011-08-24 NOTE — Telephone Encounter (Signed)
RX FOR ESTRADIOL ON BACK PER DR. G OKAY FOR PT TO HAVE PREMARIN 0.3MG . RX FAXED ON 07/23/11.

## 2011-08-30 ENCOUNTER — Encounter: Payer: Self-pay | Admitting: Internal Medicine

## 2011-10-05 DIAGNOSIS — N870 Mild cervical dysplasia: Secondary | ICD-10-CM | POA: Insufficient documentation

## 2011-10-05 DIAGNOSIS — N952 Postmenopausal atrophic vaginitis: Secondary | ICD-10-CM | POA: Insufficient documentation

## 2011-10-05 DIAGNOSIS — B977 Papillomavirus as the cause of diseases classified elsewhere: Secondary | ICD-10-CM | POA: Insufficient documentation

## 2011-10-05 DIAGNOSIS — B009 Herpesviral infection, unspecified: Secondary | ICD-10-CM | POA: Insufficient documentation

## 2011-10-05 DIAGNOSIS — N393 Stress incontinence (female) (male): Secondary | ICD-10-CM | POA: Insufficient documentation

## 2011-10-13 ENCOUNTER — Other Ambulatory Visit (HOSPITAL_COMMUNITY)
Admission: RE | Admit: 2011-10-13 | Discharge: 2011-10-13 | Disposition: A | Discharge status: Home / Self Care | Payer: BC Managed Care – PPO | Source: Ambulatory Visit | Attending: Obstetrics and Gynecology | Admitting: Obstetrics and Gynecology

## 2011-10-13 ENCOUNTER — Encounter: Payer: Self-pay | Admitting: Obstetrics and Gynecology

## 2011-10-13 ENCOUNTER — Ambulatory Visit (INDEPENDENT_AMBULATORY_CARE_PROVIDER_SITE_OTHER): Payer: BC Managed Care – PPO | Admitting: Obstetrics and Gynecology

## 2011-10-13 VITALS — BP 136/78

## 2011-10-13 DIAGNOSIS — N951 Menopausal and female climacteric states: Secondary | ICD-10-CM

## 2011-10-13 DIAGNOSIS — N870 Mild cervical dysplasia: Secondary | ICD-10-CM

## 2011-10-13 DIAGNOSIS — Z01419 Encounter for gynecological examination (general) (routine) without abnormal findings: Secondary | ICD-10-CM | POA: Insufficient documentation

## 2011-10-13 DIAGNOSIS — Z780 Asymptomatic menopausal state: Secondary | ICD-10-CM

## 2011-10-13 NOTE — Progress Notes (Signed)
The patient came back to see me today for followup of her cervical dysplasia. Her last Pap smear was actually her first normal one since the diagnosis had been made. She also told me that she's been able to reduce her hormone replacement I just taking the Activella without the extra estradiol. She seems to be doing well.  Pelvic exam: Kennon Portela present. External: Within normal limits. BUS: Within normal limits. Vaginal: Within normal limits.  cervix: Within normal limits.  Assessment: #1. CIN-1 #2. Menopausal symptoms  Plan: RTO 6 months. Discussed ways to reduce her Activella by taking every other day. She will explore that.

## 2011-11-24 ENCOUNTER — Other Ambulatory Visit (HOSPITAL_COMMUNITY): Payer: Self-pay | Admitting: Internal Medicine

## 2011-11-24 DIAGNOSIS — R0602 Shortness of breath: Secondary | ICD-10-CM

## 2011-11-26 ENCOUNTER — Ambulatory Visit (HOSPITAL_COMMUNITY)
Admission: RE | Admit: 2011-11-26 | Discharge: 2011-11-26 | Disposition: A | Discharge status: Home / Self Care | Payer: BC Managed Care – PPO | Source: Ambulatory Visit | Attending: Internal Medicine | Admitting: Internal Medicine

## 2012-03-15 ENCOUNTER — Other Ambulatory Visit: Payer: Self-pay | Admitting: *Deleted

## 2012-03-15 DIAGNOSIS — Z853 Personal history of malignant neoplasm of breast: Secondary | ICD-10-CM

## 2012-03-16 ENCOUNTER — Other Ambulatory Visit: Payer: Self-pay | Admitting: Obstetrics and Gynecology

## 2012-03-16 DIAGNOSIS — Z853 Personal history of malignant neoplasm of breast: Secondary | ICD-10-CM

## 2012-03-24 ENCOUNTER — Encounter: Payer: Self-pay | Admitting: Internal Medicine

## 2012-04-10 ENCOUNTER — Other Ambulatory Visit: Payer: Self-pay | Admitting: Obstetrics and Gynecology

## 2012-04-13 ENCOUNTER — Other Ambulatory Visit (HOSPITAL_COMMUNITY)
Admission: RE | Admit: 2012-04-13 | Discharge: 2012-04-13 | Disposition: A | Discharge status: Home / Self Care | Payer: BC Managed Care – PPO | Source: Ambulatory Visit | Attending: Obstetrics and Gynecology | Admitting: Obstetrics and Gynecology

## 2012-04-13 ENCOUNTER — Encounter: Payer: Self-pay | Admitting: Obstetrics and Gynecology

## 2012-04-13 ENCOUNTER — Ambulatory Visit (INDEPENDENT_AMBULATORY_CARE_PROVIDER_SITE_OTHER): Payer: BC Managed Care – PPO | Admitting: Obstetrics and Gynecology

## 2012-04-13 VITALS — BP 136/80 | Ht 63.0 in | Wt 125.0 lb

## 2012-04-13 DIAGNOSIS — I10 Essential (primary) hypertension: Secondary | ICD-10-CM | POA: Insufficient documentation

## 2012-04-13 DIAGNOSIS — Z01419 Encounter for gynecological examination (general) (routine) without abnormal findings: Secondary | ICD-10-CM

## 2012-04-13 DIAGNOSIS — N870 Mild cervical dysplasia: Secondary | ICD-10-CM

## 2012-04-13 MED ORDER — ESTRADIOL-NORETHINDRONE ACET 1-0.5 MG PO TABS: 1.0000 | ORAL_TABLET | Freq: Every day | ORAL | Status: DC

## 2012-04-13 MED ORDER — VALACYCLOVIR HCL 500 MG PO TABS: 500.0000 mg | ORAL_TABLET | Freq: Two times a day (BID) | ORAL | Status: DC

## 2012-04-13 NOTE — Progress Notes (Signed)
Patient came to see me today for her annual GYN exam. She continues on generic Activella 1 mg with excellent relief of hot flashes. She is having no bleeding. She no longer needs to add extra estrogen as she used to. She is having no pelvic pain. She has had 2 normal Pap smears since her diagnosis of CIN-1. She takes daily Valtrex and is not getting herpetic outbreaks. She is up-to-date on mammograms with stable calcifications. She is now had 4 normal bone densities. She does her lab through her PCP.  Physical examination: Rachel Shepherd present. HEENT within normal limits. Neck: Thyroid not large. No masses. Supraclavicular nodes: not enlarged. Breasts: Examined in both sitting and lying  position. No skin changes and no masses. Abdomen: Soft no guarding rebound or masses or hernia. Pelvic: External: Within normal limits. BUS: Within normal limits. Vaginal:within normal limits. Good estrogen effect. No evidence of cystocele rectocele or enterocele. Cervix: clean. Uterus: Normal size and shape. Adnexa: No masses. Rectovaginal exam: Confirmatory and negative. Extremities: Within normal limits.  Assessment: #1 menopausal symptoms #2 HSV-2 #3 CIN 1  Plan: Continue Valtrex daily. Continue yearly mammograms. We had our usual discussion of a slightly higher risk of breast cancer with HRT. Patient feels benefits outweigh risks and would like to continue. We discussed slightly higher risk of DVT and stroke with oral  estrogen. We discussed patch. For the moment patient declined. If this Pap smear is normal we will return to yearly visits.

## 2012-04-14 LAB — URINALYSIS W MICROSCOPIC + REFLEX CULTURE
Bacteria, UA: NONE SEEN
Bilirubin Urine: NEGATIVE
Casts: NONE SEEN
Crystals: NONE SEEN
Glucose, UA: NEGATIVE mg/dL
Hgb urine dipstick: NEGATIVE
Ketones, ur: NEGATIVE mg/dL
Leukocytes, UA: NEGATIVE
Nitrite: NEGATIVE
Protein, ur: NEGATIVE mg/dL
Specific Gravity, Urine: <1.005 — ABNORMAL LOW (ref 1.005–1.030)
Squamous Epithelial / LPF: NONE SEEN
Urobilinogen, UA: 0.2 mg/dL (ref 0.0–1.0)
pH: 5.5 (ref 5.0–8.0)

## 2012-07-24 ENCOUNTER — Telehealth: Payer: Self-pay | Admitting: *Deleted

## 2012-07-24 NOTE — Telephone Encounter (Signed)
Pt is taking valtrex 500 mg daily for suppression for HSV-2. Pt said that she thought you told to take 1 po daily. But her rx said 1 po twice daily #30? Please advise

## 2012-07-24 NOTE — Telephone Encounter (Signed)
I would take 1 daily. If she is getting reoccurrences she can go to  1 twice a day.

## 2012-07-24 NOTE — Telephone Encounter (Signed)
Left on pt voicemail okay to take 1 daily if no recurrences.

## 2012-09-12 ENCOUNTER — Encounter: Payer: Self-pay | Admitting: Internal Medicine

## 2013-04-11 ENCOUNTER — Encounter: Payer: Self-pay | Admitting: Obstetrics and Gynecology

## 2013-05-11 ENCOUNTER — Other Ambulatory Visit: Payer: Self-pay | Admitting: Obstetrics and Gynecology

## 2013-06-05 ENCOUNTER — Other Ambulatory Visit: Payer: Self-pay | Admitting: Obstetrics and Gynecology

## 2013-06-05 MED ORDER — VALACYCLOVIR HCL 500 MG PO TABS: 500.0000 mg | ORAL_TABLET | Freq: Two times a day (BID) | ORAL | Status: AC

## 2014-04-18 ENCOUNTER — Ambulatory Visit: Payer: BC Managed Care – PPO

## 2014-04-18 ENCOUNTER — Other Ambulatory Visit: Payer: Self-pay | Admitting: Emergency Medicine

## 2014-04-18 ENCOUNTER — Ambulatory Visit (INDEPENDENT_AMBULATORY_CARE_PROVIDER_SITE_OTHER): Payer: BC Managed Care – PPO | Admitting: Emergency Medicine

## 2014-04-18 VITALS — BP 128/74 | HR 73 | Temp 98.3°F | Resp 16 | Ht 63.0 in | Wt 123.6 lb

## 2014-04-18 DIAGNOSIS — M549 Dorsalgia, unspecified: Secondary | ICD-10-CM

## 2014-04-18 DIAGNOSIS — M79609 Pain in unspecified limb: Secondary | ICD-10-CM

## 2014-04-18 DIAGNOSIS — S139XXA Sprain of joints and ligaments of unspecified parts of neck, initial encounter: Secondary | ICD-10-CM

## 2014-04-18 MED ORDER — NAPROXEN SODIUM 550 MG PO TABS: 550.0000 mg | ORAL_TABLET | Freq: Two times a day (BID) | ORAL | Status: DC

## 2014-04-18 MED ORDER — CYCLOBENZAPRINE HCL 5 MG PO TABS: 5.0000 mg | ORAL_TABLET | Freq: Three times a day (TID) | ORAL | Status: DC | PRN
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
## 2014-04-18 NOTE — Patient Instructions (Signed)
Lumbosacral Strain Lumbosacral strain is a strain of any of the parts that make up your lumbosacral vertebrae. Your lumbosacral vertebrae are the bones that make up the lower third of your backbone. Your lumbosacral vertebrae are held together by muscles and tough, fibrous tissue (ligaments).  CAUSES  A sudden blow to your back can cause lumbosacral strain. Also, anything that causes an excessive stretch of the muscles in the low back can cause this strain. This is typically seen when people exert themselves strenuously, fall, lift heavy objects, bend, or crouch repeatedly. RISK FACTORS  Physically demanding work.  Participation in pushing or pulling sports or sports that require sudden twist of the back (tennis, golf, baseball).  Weight lifting.  Excessive lower back curvature.  Forward-tilted pelvis.  Weak back or abdominal muscles or both.  Tight hamstrings. SIGNS AND SYMPTOMS  Lumbosacral strain may cause pain in the area of your injury or pain that moves (radiates) down your leg.  DIAGNOSIS Your health care provider can often diagnose lumbosacral strain through a physical exam. In some cases, you may need tests such as X-ray exams.  TREATMENT  Treatment for your lower back injury depends on many factors that your clinician will have to evaluate. However, most treatment will include the use of anti-inflammatory medicines. HOME CARE INSTRUCTIONS   Avoid hard physical activities (tennis, racquetball, waterskiing) if you are not in proper physical condition for it. This may aggravate or create problems.  If you have a back problem, avoid sports requiring sudden body movements. Swimming and walking are generally safer activities.  Maintain good posture.  Maintain a healthy weight.  For acute conditions, you may put ice on the injured area.  Put ice in a plastic bag.  Place a towel between your skin and the bag.  Leave the ice on for 20 minutes, 2 3 times a day.  When the  low back starts healing, stretching and strengthening exercises may be recommended. SEEK MEDICAL CARE IF:  Your back pain is getting worse.  You experience severe back pain not relieved with medicines. SEEK IMMEDIATE MEDICAL CARE IF:   You have numbness, tingling, weakness, or problems with the use of your arms or legs.  There is a change in bowel or bladder control.  You have increasing pain in any area of the body, including your belly (abdomen).  You notice shortness of breath, dizziness, or feel faint.  You feel sick to your stomach (nauseous), are throwing up (vomiting), or become sweaty.  You notice discoloration of your toes or legs, or your feet get very cold. MAKE SURE YOU:   Understand these instructions.  Will watch your condition.  Will get help right away if you are not doing well or get worse. Document Released: 09/08/2005 Document Revised: 09/19/2013 Document Reviewed: 07/18/2013 Exit[employer] Patient Information 2014 [employer]. Cervical Sprain A cervical sprain is an injury in the neck in which the strong, fibrous tissues (ligaments) that connect your neck bones stretch or tear. Cervical sprains can range from mild to severe. Severe cervical sprains can cause the neck vertebrae to be unstable. This can lead to damage of the spinal cord and can result in serious nervous system problems. The amount of time it takes for a cervical sprain to get better depends on the cause and extent of the injury. Most cervical sprains heal in 1 to 3 weeks. CAUSES  Severe cervical sprains may be caused by:   Contact sport injuries (such as from football, rugby, wrestling, hockey, auto   racing, gymnastics, diving, martial arts, or boxing).   Motor vehicle collisions.   Whiplash injuries. This is an injury from a sudden forward-and backward whipping movement of the head and neck.  Falls.  Mild cervical sprains may be caused by:   Being in an awkward position, such as while  cradling a telephone between your ear and shoulder.   Sitting in a chair that does not offer proper support.   Working at a poorly designed computer station.   Looking up or down for long periods of time.  SYMPTOMS   Pain, soreness, stiffness, or a burning sensation in the front, back, or sides of the neck. This discomfort may develop immediately after the injury or slowly, 24 hours or more after the injury.   Pain or tenderness directly in the middle of the back of the neck.   Shoulder or upper back pain.   Limited ability to move the neck.   Headache.   Dizziness.   Weakness, numbness, or tingling in the hands or arms.   Muscle spasms.   Difficulty swallowing or chewing.   Tenderness and swelling of the neck.  DIAGNOSIS  Most of the time your health care provider can diagnose a cervical sprain by taking your history and doing a physical exam. Your health care provider will ask about previous neck injuries and any known neck problems, such as arthritis in the neck. X-rays may be taken to find out if there are any other problems, such as with the bones of the neck. Other tests, such as a CT scan or MRI, may also be needed.  TREATMENT  Treatment depends on the severity of the cervical sprain. Mild sprains can be treated with rest, keeping the neck in place (immobilization), and pain medicines. Severe cervical sprains are immediately immobilized. Further treatment is done to help with pain, muscle spasms, and other symptoms and may include:  Medicines, such as pain relievers, numbing medicines, or muscle relaxants.   Physical therapy. This may involve stretching exercises, strengthening exercises, and posture training. Exercises and improved posture can help stabilize the neck, strengthen muscles, and help stop symptoms from returning.  HOME CARE INSTRUCTIONS   Put ice on the injured area.   Put ice in a plastic bag.   Place a towel between your skin and the  bag.   Leave the ice on for 15 20 minutes, 3 4 times a day.   If your injury was severe, you may have been given a cervical collar to wear. A cervical collar is a two-piece collar designed to keep your neck from moving while it heals.  Do not remove the collar unless instructed by your health care provider.  If you have long hair, keep it outside of the collar.  Ask your health care provider before making any adjustments to your collar. Minor adjustments may be required over time to improve comfort and reduce pressure on your chin or on the back of your head.  Ifyou are allowed to remove the collar for cleaning or bathing, follow your health care provider's instructions on how to do so safely.  Keep your collar clean by wiping it with mild soap and water and drying it completely. If the collar you have been given includes removable pads, remove them every 1 2 days and hand wash them with soap and water. Allow them to air dry. They should be completely dry before you wear them in the collar.  If you are allowed to remove the collar   for cleaning and bathing, wash and dry the skin of your neck. Check your skin for irritation or sores. If you see any, tell your health care provider.  Do not drive while wearing the collar.   Only take over-the-counter or prescription medicines for pain, discomfort, or fever as directed by your health care provider.   Keep all follow-up appointments as directed by your health care provider.   Keep all physical therapy appointments as directed by your health care provider.   Make any needed adjustments to your workstation to promote good posture.   Avoid positions and activities that make your symptoms worse.   Warm up and stretch before being active to help prevent problems.  SEEK MEDICAL CARE IF:   Your pain is not controlled with medicine.   You are unable to decrease your pain medicine over time as planned.   Your activity level is not  improving as expected.  SEEK IMMEDIATE MEDICAL CARE IF:   You develop any bleeding.  You develop stomach upset.  You have signs of an allergic reaction to your medicine.   Your symptoms get worse.   You develop new, unexplained symptoms.   You have numbness, tingling, weakness, or paralysis in any part of your body.  MAKE SURE YOU:   Understand these instructions.  Will watch your condition.  Will get help right away if you are not doing well or get worse. Document Released: 09/26/2007 Document Revised: 09/19/2013 Document Reviewed: 06/06/2013 Exit[employer] Patient Information 2014 [employer].  

## 2014-05-28 ENCOUNTER — Encounter: Payer: Self-pay | Admitting: Women's Health

## 2014-05-28 ENCOUNTER — Ambulatory Visit (INDEPENDENT_AMBULATORY_CARE_PROVIDER_SITE_OTHER): Payer: BC Managed Care – PPO | Admitting: Women's Health

## 2014-05-28 VITALS — BP 150/88 | Ht 63.0 in | Wt 122.0 lb

## 2014-05-28 DIAGNOSIS — A499 Bacterial infection, unspecified: Secondary | ICD-10-CM

## 2014-05-28 DIAGNOSIS — N898 Other specified noninflammatory disorders of vagina: Secondary | ICD-10-CM

## 2014-05-28 DIAGNOSIS — B009 Herpesviral infection, unspecified: Secondary | ICD-10-CM

## 2014-05-28 DIAGNOSIS — N760 Acute vaginitis: Secondary | ICD-10-CM

## 2014-05-28 DIAGNOSIS — B9689 Other specified bacterial agents as the cause of diseases classified elsewhere: Secondary | ICD-10-CM

## 2014-05-28 DIAGNOSIS — Z01419 Encounter for gynecological examination (general) (routine) without abnormal findings: Secondary | ICD-10-CM

## 2014-05-28 LAB — WET PREP FOR TRICH, YEAST, CLUE
Trich, Wet Prep: NONE SEEN
Yeast Wet Prep HPF POC: NONE SEEN

## 2014-05-28 MED ORDER — METRONIDAZOLE 0.75 % VA GEL: VAGINAL | Status: DC

## 2014-05-28 MED ORDER — VALACYCLOVIR HCL 500 MG PO TABS: 500.0000 mg | ORAL_TABLET | Freq: Two times a day (BID) | ORAL | Status: DC

## 2014-05-28 NOTE — Patient Instructions (Signed)
Health Recommendations for Postmenopausal Women Respected and ongoing research has looked at the most common causes of death, disability, and poor quality of life in postmenopausal women. The causes include heart disease, diseases of blood vessels, diabetes, depression, cancer, and bone loss (osteoporosis). Many things can be done to help lower the chances of developing these and other common problems: CARDIOVASCULAR DISEASE Heart Disease: A heart attack is a medical emergency. Know the signs and symptoms of a heart attack. Below are things women can do to reduce their risk for heart disease.   Do not smoke. If you smoke, quit.  Aim for a healthy weight. Being overweight causes many preventable deaths. Eat a healthy and balanced diet and drink an adequate amount of liquids.  Get moving. Make a commitment to be more physically active. Aim for 30 minutes of activity on most, if not all days of the week.  Eat for heart health. Choose a diet that is low in saturated fat and cholesterol and eliminate trans fat. Include whole grains, vegetables, and fruits. Read and understand the labels on food containers before buying.  Know your numbers. Ask your caregiver to check your blood pressure, cholesterol (total, HDL, LDL, triglycerides) and blood glucose. Work with your caregiver on improving your entire clinical picture.  High blood pressure. Limit or stop your table salt intake (try salt substitute and food seasonings). Avoid salty foods and drinks. Read labels on food containers before buying. Eating well and exercising can help control high blood pressure. STROKE  Stroke is a medical emergency. Stroke may be the result of a blood clot in a blood vessel in the brain or by a brain hemorrhage (bleeding). Know the signs and symptoms of a stroke. To lower the risk of developing a stroke:  Avoid fatty foods.  Quit smoking.  Control your diabetes, blood pressure, and irregular heart rate. THROMBOPHLEBITIS  (BLOOD CLOT) OF THE LEG  Becoming overweight and leading a stationary lifestyle may also contribute to developing blood clots. Controlling your diet and exercising will help lower the risk of developing blood clots. CANCER SCREENING  Breast Cancer: Take steps to reduce your risk of breast cancer.  You should practice "breast self-awareness." This means understanding the normal appearance and feel of your breasts and should include breast self-examination. Any changes detected, no matter how small, should be reported to your caregiver.  After age 40, you should have a clinical breast exam (CBE) every year.  Starting at age 40, you should consider having a mammogram (breast X-ray) every year.  If you have a family history of breast cancer, talk to your caregiver about genetic screening.  If you are at high risk for breast cancer, talk to your caregiver about having an MRI and a mammogram every year.  Intestinal or Stomach Cancer: Tests to consider are a rectal exam, fecal occult blood, sigmoidoscopy, and colonoscopy. Women who are high risk may need to be screened at an earlier age and more often.  Cervical Cancer:  Beginning at age 30, you should have a Pap test every 3 years as long as the past 3 Pap tests have been normal.  If you have had past treatment for cervical cancer or a condition that could lead to cancer, you need Pap tests and screening for cancer for at least 20 years after your treatment.  If you had a hysterectomy for a problem that was not cancer or a condition that could lead to cancer, then you no longer need Pap tests.    If you are between ages 65 and 70, and you have had normal Pap tests going back 10 years, you no longer need Pap tests.  If Pap tests have been discontinued, risk factors (such as a new sexual partner) need to be reassessed to determine if screening should be resumed.  Some medical problems can increase the chance of getting cervical cancer. In these  cases, your caregiver may recommend more frequent screening and Pap tests.  Uterine Cancer: If you have vaginal bleeding after reaching menopause, you should notify your caregiver.  Ovarian cancer: Other than yearly pelvic exams, there are no reliable tests available to screen for ovarian cancer at this time except for yearly pelvic exams.  Lung Cancer: Yearly chest X-rays can detect lung cancer and should be done on high risk women, such as cigarette smokers and women with chronic lung disease (emphysema).  Skin Cancer: A complete body skin exam should be done at your yearly examination. Avoid overexposure to the sun and ultraviolet light lamps. Use a strong sun block cream when in the sun. All of these things are important in lowering the risk of skin cancer. MENOPAUSE Menopause Symptoms: Hormone therapy products are effective for treating symptoms associated with menopause:  Moderate to severe hot flashes.  Night sweats.  Mood swings.  Headaches.  Tiredness.  Loss of sex drive.  Insomnia.  Other symptoms. Hormone replacement carries certain risks, especially in older women. Women who use or are thinking about using estrogen or estrogen with progestin treatments should discuss that with their caregiver. Your caregiver will help you understand the benefits and risks. The ideal dose of hormone replacement therapy is not known. The Food and Drug Administration (FDA) has concluded that hormone therapy should be used only at the lowest doses and for the shortest amount of time to reach treatment goals.  OSTEOPOROSIS Protecting Against Bone Loss and Preventing Fracture: If you use hormone therapy for prevention of bone loss (osteoporosis), the risks for bone loss must outweigh the risk of the therapy. Ask your caregiver about other medications known to be safe and effective for preventing bone loss and fractures. To guard against bone loss or fractures, the following is recommended:  If  you are less than age 50, take 1000 mg of calcium and at least 600 mg of Vitamin D per day.  If you are greater than age 50 but less than age 70, take 1200 mg of calcium and at least 600 mg of Vitamin D per day.  If you are greater than age 70, take 1200 mg of calcium and at least 800 mg of Vitamin D per day. Smoking and excessive alcohol intake increases the risk of osteoporosis. Eat foods rich in calcium and vitamin D and do weight bearing exercises several times a week as your caregiver suggests. DIABETES Diabetes Melitus: If you have Type I or Type 2 diabetes, you should keep your blood sugar under control with diet, exercise and recommended medication. Avoid too many sweets, starchy and fatty foods. Being overweight can make control more difficult. COGNITION AND MEMORY Cognition and Memory: Menopausal hormone therapy is not recommended for the prevention of cognitive disorders such as Alzheimer's disease or memory loss.  DEPRESSION  Depression may occur at any age, but is common in elderly women. The reasons may be because of physical, medical, social (loneliness), or financial problems and needs. If you are experiencing depression because of medical problems and control of symptoms, talk to your caregiver about this. Physical activity and   exercise may help with mood and sleep. Community and volunteer involvement may help your sense of value and worth. If you have depression and you feel that the problem is getting worse or becoming severe, talk to your caregiver about treatment options that are best for you. ACCIDENTS  Accidents are common and can be serious in the elderly woman. Prepare your house to prevent accidents. Eliminate throw rugs, place hand bars in the bath, shower and toilet areas. Avoid wearing high heeled shoes or walking on wet, snowy, and icy areas. Limit or stop driving if you have vision or hearing problems, or you feel you are unsteady with you movements and  reflexes. HEPATITIS C Hepatitis C is a type of viral infection affecting the liver. It is spread mainly through contact with blood from an infected person. It can be treated, but if left untreated, it can lead to severe liver damage over years. Many people who are infected do not know that the virus is in their blood. If you are a "baby-boomer", it is recommended that you have one screening test for Hepatitis C. IMMUNIZATIONS  Several immunizations are important to consider having during your senior years, including:   Tetanus, diptheria, and pertussis booster shot.  Influenza every year before the flu season begins.  Pneumonia vaccine.  Shingles vaccine.  Others as indicated based on your specific needs. Talk to your caregiver about these. Document Released: 01/21/2006 Document Revised: 11/15/2012 Document Reviewed: 09/16/2008 ExitCare Patient Information 2014 [employer].  

## 2014-05-28 NOTE — Progress Notes (Addendum)
Rachel Shepherd 12/08/1951 [id number]    History:    Presents for annual exam.  Postmenopausal/HRT last year/no bleeding. History of CIN-1 with  normal Paps for 5 years. Previous patient of Dr. Eda PaschalGottsegen, was seen at either GYN office last year.  Normal mammogram history, mother breast cancer age 65. Normal DEXA at primary care x3. 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 Current on immunizations. 2009, benign colon polyp.  Past medical history, past surgical history, family history and social history were all reviewed and documented in the EPIC chart.   ROS:  A  12 point ROS was performed and pertinent positives and negatives are included.  Exam:  Filed Vitals:   05/28/14 1433  BP: 150/88    General appearance:  Normal Thyroid:  Symmetrical, normal in size, without palpable masses or nodularity. Respiratory  Auscultation:  Clear without wheezing or rhonchi Cardiovascular  Auscultation:  Regular rate, without rubs, murmurs or gallops  Edema/varicosities:  Not grossly evident Abdominal  Soft,nontender, without masses, guarding or rebound.  Liver/spleen:  No organomegaly noted  Hernia:  None appreciated  Skin  Inspection:  Grossly normal   Breasts: Examined lying and sitting.     Right: Without masses, retractions, discharge or axillary adenopathy.     Left: Without masses, retractions, discharge or axillary adenopathy. Gentitourinary   Inguinal/mons:  Normal without inguinal adenopathy  External genitalia:  Normal  BUS/Urethra/Skene's glands:  Normal  Vagina:  Wet prep positive for amines, clues, TNTC bacteria  Cervix:  Normal  Uterus:  normal in size, shape and contour.  Midline and mobile  Adnexa/parametria:     Rt: Without masses or tenderness.   Lt: Without masses or tenderness.  Anus and perineum: Normal  Digital rectal exam: Normal sphincter tone without palpated masses or tenderness  Assessment/Plan:  63 y.o. MWF  G0 for annual exam with no complaints.  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 SBE's, continue annual mammogram 3 D tomography reviewed and encouraged history of dense breast tissue. MetroGel vaginal cream 1 applicator at bedtime x5, alcohol precautions reviewed. Pap, new screening guidelines reviewed. Reviewed blood pressure elevated, states it is in the normal range at home, monitors on a regular basis   Note: This dictation was prepared with Dragon/digital dictation.  Any transcriptional errors that result are unintentional. Harrington ChallengerYOUNG,Thayden Lemire J [facility] JerseyWHNP, 5:45 PM 05/28/2014

## 2014-05-30 ENCOUNTER — Encounter: Payer: BC Managed Care – PPO | Admitting: Women's Health

## 2014-06-11 ENCOUNTER — Other Ambulatory Visit: Payer: Self-pay | Admitting: Women's Health

## 2014-06-11 ENCOUNTER — Telehealth: Payer: Self-pay | Admitting: *Deleted

## 2014-06-11 DIAGNOSIS — Z79890 Hormone replacement therapy (postmenopausal): Secondary | ICD-10-CM

## 2014-06-11 MED ORDER — ESTRADIOL-NORETHINDRONE ACET 0.5-0.1 MG PO TABS: 1.0000 | ORAL_TABLET | Freq: Every day | ORAL | Status: DC

## 2014-06-11 NOTE — Telephone Encounter (Signed)
Pt called c/o increase hot flashes and trouble with sleep at night, pt asked if she could start back on HRT? Pt had annual 05/28/14. Please advise

## 2014-06-11 NOTE — Telephone Encounter (Signed)
Telephone call, states has been off HRT for 2 months, stating flashes have increased and has had poor sleep, had been taking it every other day, Activella 0.05/0.1 will start back on every other day. Reviewed risks of blood clots and strokes.

## 2014-07-02 ENCOUNTER — Telehealth: Payer: Self-pay

## 2014-07-02 NOTE — Telephone Encounter (Signed)
Patient called and left a message 07/01/14 stating she needed her records send to an orthopaedist. Returned call and left a message advising patient to please call us back to discuss what records she wanted sent and to where. Also advised patient that she could print off request form from our website, fill it out, and fax it back to us.

## 2014-09-17 ENCOUNTER — Telehealth: Payer: Self-pay | Admitting: *Deleted

## 2014-09-17 NOTE — Telephone Encounter (Signed)
[employer] [facility] denied Mimvey Lo 05.mg-0.1 mg high risk medication

## 2014-09-27 ENCOUNTER — Other Ambulatory Visit: Payer: Self-pay

## 2014-10-01 ENCOUNTER — Encounter: Payer: Self-pay | Admitting: Women's Health

## 2014-10-01 ENCOUNTER — Ambulatory Visit (INDEPENDENT_AMBULATORY_CARE_PROVIDER_SITE_OTHER): Payer: Medicare Other | Admitting: Women's Health

## 2014-10-01 VITALS — BP 130/80 | Ht 63.0 in | Wt 122.0 lb

## 2014-10-01 DIAGNOSIS — Z79890 Hormone replacement therapy (postmenopausal): Secondary | ICD-10-CM

## 2014-10-01 DIAGNOSIS — F411 Generalized anxiety disorder: Secondary | ICD-10-CM

## 2014-10-01 MED ORDER — ALPRAZOLAM 0.25 MG PO TABS: 0.2500 mg | ORAL_TABLET | Freq: Every evening | ORAL | Status: DC | PRN

## 2014-10-01 MED ORDER — ESTRADIOL 0.5 MG PO TABS: 0.5000 mg | ORAL_TABLET | Freq: Every day | ORAL | Status: DC

## 2014-10-01 MED ORDER — MEDROXYPROGESTERONE ACETATE 10 MG PO TABS: 10.0000 mg | ORAL_TABLET | Freq: Every day | ORAL | Status: DC

## 2014-10-01 NOTE — Patient Instructions (Signed)

## 2014-10-01 NOTE — Progress Notes (Signed)
Patient ID: [name], female   DOB: 01/07/1951, 63 y.o.   MRN: [id number] Presents to discuss hormone therapy. [facility] Activella 0.5/0.1 ($130.). Currently taking every other day, trying to decrease dosage. Has been on greater than 10 years. Continues to have some hot flashes. Not sexually active/husbands health. Also complained of occasional anxiety mostly related to work issues. Denies any feelings of depression, harming self, has used Xanax in the past and requested a refill.   Exam: Appears well.  Postmenopausal on HRT with no bleeding  Plan: HRT reviewed,  reviewed risks of blood clots, strokes, breast cancer with prolonged HR T. therapy. Would like to continue to try to wean off and stop. Estradiol 0.5 daily and Provera 10 day 1- 12  each month and will decrease frequency of medication and stop over the next few months. Xanax 0.25 prescription, proper use, reviewed addictive properties instructed to use sparingly.

## 2014-10-09 ENCOUNTER — Other Ambulatory Visit: Payer: Self-pay | Admitting: *Deleted

## 2014-10-09 NOTE — Telephone Encounter (Signed)
pharmacy refill request for Metrogel 0.75% I called and denied Rx, pt will need OV.

## 2014-10-10 ENCOUNTER — Telehealth: Payer: Self-pay | Admitting: *Deleted

## 2014-10-10 MED ORDER — FLUCONAZOLE 150 MG PO TABS: 150.0000 mg | ORAL_TABLET | Freq: Once | ORAL | Status: DC

## 2014-10-10 NOTE — Telephone Encounter (Signed)
Diflucan 150 to take on her cruise would be a better choice if vaginal itching for trip. MetroGel is antibiotic, no alcohol if taking, Hope she has a great trip

## 2014-10-10 NOTE — Telephone Encounter (Signed)
Pt informed Rx sent. 

## 2014-10-10 NOTE — Telephone Encounter (Signed)
Pt had annual on 10/01/14 states later that day she thought she had yeast infection c/o vaginal discomfort. Pt had some metrogel left over from June visit. Pt used for 4 days and feels better, I explained to pt metrogel is for BV infection, not yeast. Pt said she is leaving for a cruise in November and would like to have some metrogel on hand. Pt said she had some slight itching, no odor , no discharge. Please advise

## 2014-10-24 ENCOUNTER — Telehealth: Payer: Self-pay | Admitting: *Deleted

## 2014-10-24 NOTE — Telephone Encounter (Signed)
Pt medication for estradiol 0.05 mg require coverage determination, [facility], will wait for response.

## 2014-10-28 NOTE — Telephone Encounter (Signed)
Pt estradiol 0.05mg  was denied because medication is high risk and a written explanation of the specific benefit to continue medication is required. I will type letter if you just let me know what you want it to say.

## 2014-10-28 NOTE — Telephone Encounter (Signed)
You could also call her and let her know to NOT use insurance it is about $4 with out insurance, she has hot flashes without.

## 2014-10-30 ENCOUNTER — Encounter: Payer: Self-pay | Admitting: *Deleted

## 2014-10-30 NOTE — Telephone Encounter (Signed)
Letter faxed to appeal for the below, will wait for response.

## 2014-10-30 NOTE — Telephone Encounter (Signed)
Please included in the letter. Rachel Shepherd has increased hot flushes, poor sleep and generally does not feel as well when off of HRT. Rachel Shepherd has numerous symptoms and prefers to take estradiol daily, aware of risks and would like to continue on HRT.

## 2014-10-30 NOTE — Telephone Encounter (Signed)
Patient would like letter supporting why she is taking HRT. Let me know what to say and I will type.

## 2014-11-05 NOTE — Telephone Encounter (Signed)
Estradiol 0.5 mg tablet has been approved through 10/26/15.

## 2014-11-20 ENCOUNTER — Telehealth: Payer: Self-pay | Admitting: *Deleted

## 2014-11-20 NOTE — Telephone Encounter (Signed)
Pt said she trying to wean off HRT takes estradiol 0.5 mg daily and provera 10 mg day 1-12. Pt said she is taking estradiol 0.5 mg every 3rd day of the week. Pt asked how would she wean off the provera? Still take day 1-12 monthly? Please advise

## 2014-11-20 NOTE — Telephone Encounter (Signed)
Telephone call, reviewed take half tablet of estradiol daily and continue to wean down continue Provera to help protect uterus.

## 2015-01-08 ENCOUNTER — Telehealth: Payer: Self-pay | Admitting: *Deleted

## 2015-01-08 NOTE — Telephone Encounter (Signed)
Pt takes estradiol 0.5 mg and provera 10 mg day 1-12 of the month. Pt started provera on Jan 10th took for 12 days. Her question is does she take the same way in february? Or okay to start on Feb 1? Please advise

## 2015-01-08 NOTE — Telephone Encounter (Signed)
Telephone call, taking half tablet of the estradiol trying to wean down will continue that daily and take the Provera day 1 through 12 starting back in March. After several months of taking half tablet of the estradiol will wean down on the Provera take half tablet  estradiol  daily and half tablet of Provera day 1 through 12. Questions answered.

## 2015-02-11 ENCOUNTER — Telehealth: Payer: Self-pay | Admitting: *Deleted

## 2015-02-11 NOTE — Telephone Encounter (Signed)
Pt called with questions about her what nancy told her on telephone encounter 01/08/15. I read the same note as noted on this telephone encounter 01/08/15.

## 2015-02-26 ENCOUNTER — Telehealth: Payer: Self-pay

## 2015-02-26 NOTE — Telephone Encounter (Signed)
Yes, she has been taking half dose daily for greater than 6 months could try every other day and then stop in the fall when the weather is cooler.

## 2015-02-26 NOTE — Telephone Encounter (Signed)
Just finished taking her progesterone for 12 days. Still taking estrogen but just 1/2 tab q day.  She's feeling a little warm at night but nothing she can't tolerate. She really wants to get off the HRT and wondered should she go to 1/2 tab every other day? She figured she should not just stop them "cold [place]".

## 2015-02-26 NOTE — Telephone Encounter (Signed)
Patient informed. Advised her to continue with progesterone 12 days every mos as prescribed.

## 2015-05-05 ENCOUNTER — Encounter: Payer: Self-pay | Admitting: Women's Health

## 2015-05-07 ENCOUNTER — Other Ambulatory Visit: Payer: Self-pay | Admitting: *Deleted

## 2015-05-07 DIAGNOSIS — Z79890 Hormone replacement therapy (postmenopausal): Secondary | ICD-10-CM

## 2015-05-07 MED ORDER — MEDROXYPROGESTERONE ACETATE 10 MG PO TABS: 10.0000 mg | ORAL_TABLET | Freq: Every day | ORAL | Status: DC

## 2015-06-03 ENCOUNTER — Encounter: Payer: Self-pay | Admitting: Women's Health

## 2015-06-03 ENCOUNTER — Ambulatory Visit (INDEPENDENT_AMBULATORY_CARE_PROVIDER_SITE_OTHER): Payer: Medicare Other | Admitting: Women's Health

## 2015-06-03 VITALS — BP 122/78 | Ht 63.0 in | Wt 121.0 lb

## 2015-06-03 DIAGNOSIS — Z79890 Hormone replacement therapy (postmenopausal): Secondary | ICD-10-CM | POA: Diagnosis not present

## 2015-06-03 DIAGNOSIS — B009 Herpesviral infection, unspecified: Secondary | ICD-10-CM

## 2015-06-03 DIAGNOSIS — Z01419 Encounter for gynecological examination (general) (routine) without abnormal findings: Secondary | ICD-10-CM

## 2015-06-03 MED ORDER — ESTRADIOL 0.5 MG PO TABS: 0.5000 mg | ORAL_TABLET | Freq: Every day | ORAL | Status: DC

## 2015-06-03 MED ORDER — VALACYCLOVIR HCL 500 MG PO TABS: 500.0000 mg | ORAL_TABLET | Freq: Two times a day (BID) | ORAL | Status: DC

## 2015-06-03 MED ORDER — MEDROXYPROGESTERONE ACETATE 10 MG PO TABS: 10.0000 mg | ORAL_TABLET | Freq: Every day | ORAL | Status: DC

## 2015-06-03 NOTE — Patient Instructions (Signed)

## 2015-06-03 NOTE — Progress Notes (Signed)
Rachel Shepherd [birth year] [id number]    History:    Presents for annual exam.  Postmenopausal on HRT with no bleeding. Reports normal labs and DEXA per primary care. Normal mammogram history. Abnormal Pap years ago 2014 normal with negative HR HPV. 2015 benign colon polyp. Current on vaccines.  Past medical history, past surgical history, family history and social history were all reviewed and documented in the EPIC chart. Planning a trans [employer] this summer. Working part time.  ROS:  A ROS was performed and pertinent positives and negatives are included.  Exam:  Filed Vitals:   06/03/15 1430  BP: 122/78    General appearance:  Normal Thyroid:  Symmetrical, normal in size, without palpable masses or nodularity. Respiratory  Auscultation:  Clear without wheezing or rhonchi Cardiovascular  Auscultation:  Regular rate, without rubs, murmurs or gallops  Edema/varicosities:  Not grossly evident Abdominal  Soft,nontender, without masses, guarding or rebound.  Liver/spleen:  No organomegaly noted  Hernia:  None appreciated  Skin  Inspection:  Grossly normal   Breasts: Examined lying and sitting.     Right: Without masses, retractions, discharge or axillary adenopathy.     Left: Without masses, retractions, discharge or axillary adenopathy. Gentitourinary   Inguinal/mons:  Normal without inguinal adenopathy  External genitalia:  Normal  BUS/Urethra/Skene's glands:  Normal  Vagina:  Normal  Cervix:  Normal  Uterus:   normal in size, shape and contour.  Midline and mobile  Adnexa/parametria:     Rt: Without masses or tenderness.   Lt: Without masses or tenderness.  Anus and perineum: Normal  Digital rectal exam: Normal sphincter tone without palpated masses or tenderness  Assessment/Plan:  64 y.o. MWF G0 for annual exam with no complaints.  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 Provera 10 mg by mouth first 12 days of every evening month and until no longer taking estrogen. Aware of risks of blood clots, strokes, breast cancer and has tried to decrease and stop this year. E's, continue annual 3-D mammogram history of dense breast. Continue regular exercise, calcium rich diet, and decrease tanning. UA. Pap normal with negative HR HPV 2014, new screening guidelines reviewed. Valtrex 500 takes every other day for suppression. Prescription, proper use given and reviewed.    Harrington Challenger WHNP, 3:02 PM 06/03/2015

## 2015-06-04 LAB — URINALYSIS W MICROSCOPIC + REFLEX CULTURE
Bacteria, UA: NONE SEEN
Bilirubin Urine: NEGATIVE
Casts: NONE SEEN
Crystals: NONE SEEN
Glucose, UA: NEGATIVE mg/dL
Hgb urine dipstick: NEGATIVE
Ketones, ur: NEGATIVE mg/dL
Nitrite: NEGATIVE
Protein, ur: NEGATIVE mg/dL
Specific Gravity, Urine: 1.016 (ref 1.005–1.030)
Squamous Epithelial / LPF: NONE SEEN
Urobilinogen, UA: 0.2 mg/dL (ref 0.0–1.0)
pH: 5.5 (ref 5.0–8.0)

## 2015-06-05 LAB — URINE CULTURE: Colony Count: 6000

## 2015-06-09 ENCOUNTER — Other Ambulatory Visit: Payer: Self-pay

## 2015-06-20 ENCOUNTER — Other Ambulatory Visit: Payer: Self-pay

## 2015-06-20 DIAGNOSIS — B009 Herpesviral infection, unspecified: Secondary | ICD-10-CM

## 2015-06-20 MED ORDER — VALACYCLOVIR HCL 500 MG PO TABS: ORAL_TABLET | ORAL | Status: DC

## 2015-07-17 ENCOUNTER — Telehealth: Payer: Self-pay | Admitting: *Deleted

## 2015-07-17 DIAGNOSIS — B009 Herpesviral infection, unspecified: Secondary | ICD-10-CM

## 2015-07-17 MED ORDER — VALACYCLOVIR HCL 500 MG PO TABS: ORAL_TABLET | ORAL | Status: DC

## 2015-07-17 NOTE — Telephone Encounter (Signed)
Pt is taking estradiol 0.5 mg 1/2 tablet every other day, asked how she should be taking provera 10 mg day 1-12? Pt thought you told her since taking 1/2 tablet of estradiol that provera should be every other month? Please advise

## 2015-07-17 NOTE — Telephone Encounter (Signed)
Pt informed with the below, pt asked that generic valtrex be sent to [facility] pharmacy.

## 2015-07-17 NOTE — Telephone Encounter (Signed)
Have her continue the estradiol half tablet -  0.5 daily and Provera 10 day 1 through 12 every other month.

## 2015-07-31 ENCOUNTER — Ambulatory Visit (HOSPITAL_BASED_OUTPATIENT_CLINIC_OR_DEPARTMENT_OTHER): Admit: 2015-07-31 | Payer: Self-pay | Admitting: Orthopedic Surgery

## 2015-07-31 ENCOUNTER — Encounter (HOSPITAL_BASED_OUTPATIENT_CLINIC_OR_DEPARTMENT_OTHER): Payer: Self-pay

## 2015-07-31 SURGERY — TENNIS ELBOW RELEASE/NIRSCHEL PROCEDURE
Anesthesia: General | Site: Elbow | Laterality: Right

## 2015-08-13 ENCOUNTER — Telehealth: Payer: Self-pay | Admitting: *Deleted

## 2015-08-13 DIAGNOSIS — B009 Herpesviral infection, unspecified: Secondary | ICD-10-CM

## 2015-08-13 MED ORDER — VALACYCLOVIR HCL 500 MG PO TABS: 500.0000 mg | ORAL_TABLET | Freq: Every day | ORAL | Status: DC

## 2015-08-13 NOTE — Telephone Encounter (Signed)
Pt called requesting Rx for generic valtrex 500 mg #30 tablet be sent to pharmacy and not #60. Rx will be sent.

## 2016-02-11 ENCOUNTER — Other Ambulatory Visit: Payer: Self-pay | Admitting: Women's Health

## 2016-03-17 ENCOUNTER — Telehealth: Payer: Self-pay

## 2016-03-17 NOTE — Telephone Encounter (Signed)
Telephone call, will cut down to a half tablet every other day and decrease and stop. States is having minimal hot flushes. Continue progesterone day one through 12 until completely off estrogen. Will evaluate in June.

## 2016-03-17 NOTE — Telephone Encounter (Signed)
Patient called because she is interested in stopping her HRT.  She said she takes 1/2 Estradiol tab daily and Progesterone Day 1-12.  She has appt with you in June but did not want to wait that long to stop.  She said she is feeling really well and thinks she may not need them anymore.  She asked about how to stop. Did not want to "stop cold [place]" without checking with you.

## 2016-04-28 ENCOUNTER — Telehealth: Payer: Self-pay | Admitting: *Deleted

## 2016-04-28 NOTE — Telephone Encounter (Signed)
Medication approved until 12/12/16 referral # [id number]

## 2016-04-28 NOTE — Telephone Encounter (Signed)
Prior authorization done via cover my meds for estradiol 0.5 mg tablets, will wait for response.

## 2016-06-03 ENCOUNTER — Encounter: Payer: Self-pay | Admitting: Women's Health

## 2016-06-03 ENCOUNTER — Ambulatory Visit (INDEPENDENT_AMBULATORY_CARE_PROVIDER_SITE_OTHER): Payer: Medicare Other | Admitting: Women's Health

## 2016-06-03 VITALS — BP 117/76 | Ht 63.0 in | Wt 119.8 lb

## 2016-06-03 DIAGNOSIS — Z79890 Hormone replacement therapy (postmenopausal): Secondary | ICD-10-CM

## 2016-06-03 DIAGNOSIS — Z01419 Encounter for gynecological examination (general) (routine) without abnormal findings: Secondary | ICD-10-CM | POA: Diagnosis not present

## 2016-06-03 DIAGNOSIS — B009 Herpesviral infection, unspecified: Secondary | ICD-10-CM

## 2016-06-03 MED ORDER — MEDROXYPROGESTERONE ACETATE 10 MG PO TABS: 10.0000 mg | ORAL_TABLET | Freq: Every day | ORAL | Status: DC

## 2016-06-03 MED ORDER — VALACYCLOVIR HCL 500 MG PO TABS: ORAL_TABLET | ORAL | Status: DC

## 2016-06-03 MED ORDER — ESTRADIOL 0.5 MG PO TABS: 0.5000 mg | ORAL_TABLET | Freq: Every day | ORAL | Status: DC

## 2016-06-03 NOTE — Patient Instructions (Addendum)
Check vit d level should be around 50 or mid range for lab values   If low end of normal could take vit D 1000 iu OtC  Get at grocery store or wherever.   Menopause is a normal process in which your reproductive ability comes to an end. This process happens gradually over a span of months to years, usually between the ages of 67 and 75. Menopause is complete when you have missed 12 consecutive menstrual periods. It is important to talk with your health care provider about some of the most common conditions that affect postmenopausal women, such as heart disease, cancer, and bone loss (osteoporosis). Adopting a healthy lifestyle and getting preventive care can help to promote your health and wellness. Those actions can also lower your chances of developing some of these common conditions. WHAT SHOULD I KNOW ABOUT MENOPAUSE? During menopause, you may experience a number of symptoms, such as:  Moderate-to-severe hot flashes.  Night sweats.  Decrease in sex drive.  Mood swings.  Headaches.  Tiredness.  Irritability.  Memory problems.  Insomnia. Choosing to treat or not to treat menopausal changes is an individual decision that you make with your health care provider. WHAT SHOULD I KNOW ABOUT HORMONE REPLACEMENT THERAPY AND SUPPLEMENTS? Hormone therapy products are effective for treating symptoms that are associated with menopause, such as hot flashes and night sweats. Hormone replacement carries certain risks, especially as you become older. If you are thinking about using estrogen or estrogen with progestin treatments, discuss the benefits and risks with your health care provider. WHAT SHOULD I KNOW ABOUT HEART DISEASE AND STROKE? Heart disease, heart attack, and stroke become more likely as you age. This may be due, in part, to the hormonal changes that your body experiences during menopause. These can affect how your body processes dietary fats, triglycerides, and cholesterol. Heart attack  and stroke are both medical emergencies. There are many things that you can do to help prevent heart disease and stroke:  Have your blood pressure checked at least every 1-2 years. High blood pressure causes heart disease and increases the risk of stroke.  If you are 67-4 years old, ask your health care provider if you should take aspirin to prevent a heart attack or a stroke.  Do not use any tobacco products, including cigarettes, chewing tobacco, or electronic cigarettes. If you need help quitting, ask your health care provider.  It is important to eat a healthy diet and maintain a healthy weight.  Be sure to include plenty of vegetables, fruits, low-fat dairy products, and lean protein.  Avoid eating foods that are high in solid fats, added sugars, or salt (sodium).  Get regular exercise. This is one of the most important things that you can do for your health.  Try to exercise for at least 150 minutes each week. The type of exercise that you do should increase your heart rate and make you sweat. This is known as moderate-intensity exercise.  Try to do strengthening exercises at least twice each week. Do these in addition to the moderate-intensity exercise.  Know your numbers.Ask your health care provider to check your cholesterol and your blood glucose. Continue to have your blood tested as directed by your health care provider. WHAT SHOULD I KNOW ABOUT CANCER SCREENING? There are several types of cancer. Take the following steps to reduce your risk and to catch any cancer development as early as possible. Breast Cancer  Practice breast self-awareness.  This means understanding how your breasts  normally appear and feel.  It also means doing regular breast self-exams. Let your health care provider know about any changes, no matter how small.  If you are 67 or older, have a clinician do a breast exam (clinical breast exam or CBE) every year. Depending on your age, family history,  and medical history, it may be recommended that you also have a yearly breast X-ray (mammogram).  If you have a family history of breast cancer, talk with your health care provider about genetic screening.  If you are at high risk for breast cancer, talk with your health care provider about having an MRI and a mammogram every year.  Breast cancer (BRCA) gene test is recommended for women who have family members with BRCA-related cancers. Results of the assessment will determine the need for genetic counseling and BRCA1 and for BRCA2 testing. BRCA-related cancers include these types:  Breast. This occurs in males or females.  Ovarian.  Tubal. This may also be called fallopian tube cancer.  Cancer of the abdominal or pelvic lining (peritoneal cancer).  Prostate.  Pancreatic. Cervical, Uterine, and Ovarian Cancer Your health care provider may recommend that you be screened regularly for cancer of the pelvic organs. These include your ovaries, uterus, and vagina. This screening involves a pelvic exam, which includes checking for microscopic changes to the surface of your cervix (Pap test).  For women ages 67-65, health care providers may recommend a pelvic exam and a Pap test every three years. For women ages 67-65, they may recommend the Pap test and pelvic exam, combined with testing for human papilloma virus (HPV), every five years. Some types of HPV increase your risk of cervical cancer. Testing for HPV may also be done on women of any age who have unclear Pap test results.  Other health care providers may not recommend any screening for nonpregnant women who are considered low risk for pelvic cancer and have no symptoms. Ask your health care provider if a screening pelvic exam is right for you.  If you have had past treatment for cervical cancer or a condition that could lead to cancer, you need Pap tests and screening for cancer for at least 20 years after your treatment. If Pap tests  have been discontinued for you, your risk factors (such as having a new sexual partner) need to be reassessed to determine if you should start having screenings again. Some women have medical problems that increase the chance of getting cervical cancer. In these cases, your health care provider may recommend that you have screening and Pap tests more often.  If you have a family history of uterine cancer or ovarian cancer, talk with your health care provider about genetic screening.  If you have vaginal bleeding after reaching menopause, tell your health care provider.  There are currently no reliable tests available to screen for ovarian cancer. Lung Cancer Lung cancer screening is recommended for adults 15-70 years old who are at high risk for lung cancer because of a history of smoking. A yearly low-dose CT scan of the lungs is recommended if you:  Currently smoke.  Have a history of at least 30 pack-years of smoking and you currently smoke or have quit within the past 15 years. A pack-year is smoking an average of one pack of cigarettes per day for one year. Yearly screening should:  Continue until it has been 15 years since you quit.  Stop if you develop a health problem that would prevent you from having  lung cancer treatment. Colorectal Cancer  This type of cancer can be detected and can often be prevented.  Routine colorectal cancer screening usually begins at age 51 and continues through age 69.  If you have risk factors for colon cancer, your health care provider may recommend that you be screened at an earlier age.  If you have a family history of colorectal cancer, talk with your health care provider about genetic screening.  Your health care provider may also recommend using home test kits to check for hidden blood in your stool.  A small camera at the end of a tube can be used to examine your colon directly (sigmoidoscopy or colonoscopy). This is done to check for the  earliest forms of colorectal cancer.  Direct examination of the colon should be repeated every 5-10 years until age 63. However, if early forms of precancerous polyps or small growths are found or if you have a family history or genetic risk for colorectal cancer, you may need to be screened more often. Skin Cancer  Check your skin from head to toe regularly.  Monitor any moles. Be sure to tell your health care provider:  About any new moles or changes in moles, especially if there is a change in a mole's shape or color.  If you have a mole that is larger than the size of a pencil eraser.  If any of your family members has a history of skin cancer, especially at a young age, talk with your health care provider about genetic screening.  Always use sunscreen. Apply sunscreen liberally and repeatedly throughout the day.  Whenever you are outside, protect yourself by wearing long sleeves, pants, a wide-brimmed hat, and sunglasses. WHAT SHOULD I KNOW ABOUT OSTEOPOROSIS? Osteoporosis is a condition in which bone destruction happens more quickly than new bone creation. After menopause, you may be at an increased risk for osteoporosis. To help prevent osteoporosis or the bone fractures that can happen because of osteoporosis, the following is recommended:  If you are 38-81 years old, get at least 1,000 mg of calcium and at least 600 mg of vitamin D per day.  If you are older than age 28 but younger than age 61, get at least 1,200 mg of calcium and at least 600 mg of vitamin D per day.  If you are older than age 14, get at least 1,200 mg of calcium and at least 800 mg of vitamin D per day. Smoking and excessive alcohol intake increase the risk of osteoporosis. Eat foods that are rich in calcium and vitamin D, and do weight-bearing exercises several times each week as directed by your health care provider. WHAT SHOULD I KNOW ABOUT HOW MENOPAUSE AFFECTS Rachel Shepherd? Depression may occur at any  age, but it is more common as you become older. Common symptoms of depression include:  Low or sad mood.  Changes in sleep patterns.  Changes in appetite or eating patterns.  Feeling an overall lack of motivation or enjoyment of activities that you previously enjoyed.  Frequent crying spells. Talk with your health care provider if you think that you are experiencing depression. WHAT SHOULD I KNOW ABOUT IMMUNIZATIONS? It is important that you get and maintain your immunizations. These include:  Tetanus, diphtheria, and pertussis (Tdap) booster vaccine.  Influenza every year before the flu season begins.  Pneumonia vaccine.  Shingles vaccine. Your health care provider may also recommend other immunizations.   This information is not intended to replace advice given  to you by your health care provider. Make sure you discuss any questions you have with your health care provider.   Document Released: 01/21/2006 Document Revised: 12/20/2014 Document Reviewed: 08/01/2014 Elsevier Interactive Patient Education [employer].

## 2016-06-03 NOTE — Progress Notes (Signed)
Rachel Shepherd 03/04/1951 [id number]    History:    Presents for breast and pelvic exam.  Postmenopausal on HRT, no bleeding.  Currently taking Estrace 1/2 tablet po every other day, Provera one tablet po daily day one through 12.  Hot flushes occasional, increased hot flashes when taking estradiol every third day.  No sexual activity, okay with both.  Last three Paps normal, abnormal Pap years ago .  Normal Mammogram history.  HSV rare outbreaks, taking Valtrex 500mg  daily for prevention.  2015 benign colon polyp.  Normal DEXA scan 2010.    Past medical history, past surgical history, family history and social history were all reviewed and documented in the EPIC chart.  Working part time from home.  [place] with [place]. Husband 20 years her senior and working full time.  ROS:  A ROS was performed and pertinent positives and negatives are included.  Exam:  Filed Vitals:   06/03/16 1445  BP: 117/76    General appearance:  Normal, well-appearing. Thyroid:  Symmetrical, normal in size, without palpable masses or nodularity. Respiratory  Auscultation:  Clear without wheezing or rhonchi Cardiovascular  Auscultation:  Regular rate, without rubs, murmurs or gallops  Edema/varicosities:  Not grossly evident Abdominal  Soft,nontender, without masses, guarding or rebound.  Liver/spleen:  No organomegaly noted  Hernia:  None appreciated  Skin  Inspection:  Grossly normal   Breasts: Examined lying and sitting.     Right: Without masses, retractions, discharge or axillary adenopathy.     Left: Without masses, retractions, discharge or axillary adenopathy. Gentitourinary   Inguinal/mons:  Normal without inguinal adenopathy  External genitalia:  Normal  BUS/Urethra/Skene's glands:  Normal  Vagina:   pale,atrophic  Cervix:  Normal  Uterus:  Normal in size, shape and contour.  Midline and mobile  Adnexa/parametria:     Rt: Without masses or tenderness.   Lt: Without  masses or tenderness.  Anus and perineum: Normal  Digital rectal exam: Normal sphincter tone without palpated masses or tenderness  Assessment/Plan:  65 y.o. MWF G0 for breast and pelvic exam with no complaints.  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  Reviewed risk of blood clots, stroke and breast cancer.  Valtrex 500mg  po daily.  SBE's, annual 3D Mammograms.  Encouraged calcium-rich diet, Vitamin D 1000mg  daily, and regular exercise.  Encouraged to continue healthy lifestyle of diet and exercise. No Pap needed per guidelines.   Harrington ChallengerYOUNG,Latasha Buczkowski J WHNP, 3:15 PM 06/03/2016

## 2016-06-23 ENCOUNTER — Encounter: Payer: Self-pay | Admitting: Internal Medicine

## 2016-08-10 ENCOUNTER — Encounter: Payer: Self-pay | Admitting: Women's Health

## 2016-09-21 ENCOUNTER — Other Ambulatory Visit: Payer: Self-pay | Admitting: Women's Health

## 2016-09-21 ENCOUNTER — Encounter: Payer: Self-pay | Admitting: Women's Health

## 2016-10-02 ENCOUNTER — Encounter: Payer: Self-pay | Admitting: Women's Health

## 2016-11-21 ENCOUNTER — Encounter: Payer: Self-pay | Admitting: Women's Health

## 2016-11-22 ENCOUNTER — Encounter: Payer: Self-pay | Admitting: Women's Health

## 2017-01-04 ENCOUNTER — Ambulatory Visit (INDEPENDENT_AMBULATORY_CARE_PROVIDER_SITE_OTHER): Payer: Medicare Other

## 2017-01-04 ENCOUNTER — Ambulatory Visit (INDEPENDENT_AMBULATORY_CARE_PROVIDER_SITE_OTHER): Payer: Medicare Other | Admitting: Orthopaedic Surgery

## 2017-01-04 ENCOUNTER — Encounter (INDEPENDENT_AMBULATORY_CARE_PROVIDER_SITE_OTHER): Payer: Self-pay | Admitting: Orthopaedic Surgery

## 2017-01-04 DIAGNOSIS — M25511 Pain in right shoulder: Secondary | ICD-10-CM | POA: Diagnosis not present

## 2017-01-04 DIAGNOSIS — M25561 Pain in right knee: Secondary | ICD-10-CM

## 2017-01-04 DIAGNOSIS — G8929 Other chronic pain: Secondary | ICD-10-CM | POA: Diagnosis not present

## 2017-01-04 NOTE — Progress Notes (Addendum)
Office Visit Note   Patient: Rachel ArchLinda Caillouet           Date of Birth: 04/11/1951           MRN: [id number] Visit Date: 01/04/2017              Requested by: Eartha InchMichael C Badger, MD [address] [place], [place] [address] PCP: Eartha InchBADGER,MICHAEL C, MD   Assessment & Plan: Visit Diagnoses:  1. Chronic right shoulder pain   2. Acute pain of right knee   3. Acute pain of right shoulder     Plan: Patient likely has rotator cuff contusion and inflammation of medial meniscus with possible degenerative tear. Patient is not overly symptomatic. I would recommend relative rest, ice, Motrin as needed. Follow-up as needed Total face to face encounter time was greater than 25 minutes and over half of this time was spent in counseling and/or coordination of care.   Follow-Up Instructions: Return if symptoms worsen or fail to improve.   Orders:  Orders Placed This Encounter  Procedures  . XR Shoulder Right  . XR KNEE 3 VIEW RIGHT   No orders of the defined types were placed in this encounter.     Procedures: No procedures performed   Clinical Data: No additional findings.   Subjective: Chief Complaint  Patient presents with  . Right Shoulder - Pain  . Right Knee - Pain    Patient is a 66 year old female who fell while skiing a week ago. She has a dull pain in her shoulder and her right knee. She twisted her right knee she wants to get it checked out.    Review of Systems  Constitutional: Negative.   HENT: Negative.   Eyes: Negative.   Respiratory: Negative.   Cardiovascular: Negative.   Endocrine: Negative.   Musculoskeletal: Negative.   Neurological: Negative.   Hematological: Negative.   Psychiatric/Behavioral: Negative.   All other systems reviewed and are negative.    Objective: Vital Signs: There were no vitals taken for this visit.  Physical Exam  Constitutional: She is oriented to person, place, and time. She appears well-developed and well-nourished.    Pulmonary/Chest: Effort normal.  Neurological: She is alert and oriented to person, place, and time.  Skin: Skin is warm. Capillary refill takes less than 2 seconds.  Psychiatric: She has a normal mood and affect. Her behavior is normal. Judgment and thought content normal.  Nursing note and vitals reviewed.   Ortho Exam Exam the right shoulder shows good rotator cuff strength with mild discomfort with testing. She has good range of motion. No impingement signs. Exam of the right knee shows no joint effusion. She has good range of motion. Normal collaterals and cruciates. Medial joint line tenderness. Negative McMurray. MCL nontender. Specialty Comments:  No specialty comments available.  Imaging: Xr Knee 3 View Right  Result Date: 01/04/2017 No acute findings  Xr Shoulder Right  Result Date: 01/04/2017 No acute findings    PMFS History: Patient Active Problem List   Diagnosis Date Noted  . Hypertension   . SUI (stress urinary incontinence, female)   . Atrophic vaginitis   . HSV-2 (herpes simplex virus 2) infection   . Dysplasia of cervix, low grade (CIN 1)   . High risk HPV infection   . PAIN IN JOINT, SITE UNSPECIFIED 12/02/2010  . SLEEP DISORDER 12/02/2010  . MUSCLE PAIN 12/09/2009  . POSTMENOPAUSAL STATUS 11/21/2009  . CHEST DISCOMFORT 09/29/2009  . COLONIC POLYPS, HX OF 07/09/2009  .  CHRONIC GRANULOMATOUS DISEASE 04/16/2008   Past Medical History:  Diagnosis Date  . Atrophic vaginitis   . Dysplasia of cervix, 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   HPV-HIGH RISK  . 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  . HSV-2 (herpes simplex virus 2) infection   . Hypertension   . SUI (stress urinary incontinence, female)     Family History  Problem Relation Age of Onset  . Heart disease Mother     PACE MAKER  . Breast cancer Mother     Age 62  . Stroke Paternal Grandmother   . Heart disease Paternal Grandmother     Past Surgical History:  Procedure Laterality Date  . COLPOSCOPY     . CYST REMOVED RIGHT CHEEK    . 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 TUBAL LIGATION    . WRIST SURGERY     Social History   Occupational History  . Not on file.   Social History Main Topics  . Smoking status: Never Smoker  . Smokeless tobacco: Never Used  . Alcohol use 8.4 oz/week    14 Standard drinks or equivalent per week  . Drug use: No  . Sexual activity: No     Comment: INTERCOURSE AGE 81, SEXUAL PARTNERS 5+++++++++++-+

## 2017-01-05 NOTE — Addendum Note (Signed)
Addended by: Mayra ReelXU, N MICHAEL on: 01/05/2017 08:30 PM   Modules accepted: Level of Service

## 2017-01-10 ENCOUNTER — Telehealth: Payer: Self-pay | Admitting: *Deleted

## 2017-01-10 DIAGNOSIS — Z79890 Hormone replacement therapy (postmenopausal): Secondary | ICD-10-CM

## 2017-01-10 MED ORDER — MEDROXYPROGESTERONE ACETATE 10 MG PO TABS: 10.0000 mg | ORAL_TABLET | Freq: Every day | ORAL | 12 refills | Status: DC

## 2017-01-10 MED ORDER — ESTRADIOL 0.5 MG PO TABS: 0.5000 mg | ORAL_TABLET | Freq: Every day | ORAL | 4 refills | Status: DC

## 2017-01-10 MED ORDER — ACYCLOVIR 200 MG PO CAPS: 200.0000 mg | ORAL_CAPSULE | Freq: Every day | ORAL | 2 refills | Status: DC | PRN

## 2017-01-10 NOTE — Telephone Encounter (Signed)
Ok, have her try 1/2 tablet of estradiol every other day, full tablet on other days, less is best but want her comfortable and of course sleep also important for health.  Acyclovir 200 or 400 daily, whatever the insurance covers, if not can get 30 of the 200mg  for $4 at [facility] without using insurance.  She uses as preventative.

## 2017-01-10 NOTE — Telephone Encounter (Signed)
Pt informed with the below note, Rx sent. 

## 2017-01-10 NOTE — Telephone Encounter (Signed)
Pt called and left message in triage voicemail stating she takes estrace 1/2 tablet of 0.5 and half tablet of medroxyprogesterone 10 mg day 1-12 monthly has noticed increased hot flashes asked if okay to increase to full dose on both Rx. I advised pt this is fine, she will now take estradiol 0.5 mg tablet daily and medroxyprogesterone 10 mg day day 1-12 monthly.   Rachel Shepherd pt asked if she could have a Rx for Acyclovir rather then Valacyclovir, states Acyclovir would be cheaper at $4, she would like Rx for this. please advise

## 2017-03-17 ENCOUNTER — Other Ambulatory Visit: Payer: Self-pay | Admitting: Women's Health

## 2017-03-17 ENCOUNTER — Encounter: Payer: Self-pay | Admitting: Women's Health

## 2017-03-17 MED ORDER — ACYCLOVIR 200 MG PO CAPS: 200.0000 mg | ORAL_CAPSULE | Freq: Every day | ORAL | 2 refills | Status: DC | PRN

## 2017-03-18 ENCOUNTER — Other Ambulatory Visit: Payer: Self-pay | Admitting: Women's Health

## 2017-03-18 DIAGNOSIS — B009 Herpesviral infection, unspecified: Secondary | ICD-10-CM

## 2017-03-19 ENCOUNTER — Other Ambulatory Visit: Payer: Self-pay | Admitting: Women's Health

## 2017-03-19 DIAGNOSIS — B009 Herpesviral infection, unspecified: Secondary | ICD-10-CM

## 2017-03-19 MED ORDER — VALACYCLOVIR HCL 500 MG PO TABS
ORAL_TABLET | ORAL | 4 refills | Status: DC
Start: 2017-03-19 — End: 2017-03-23

## 2017-03-22 ENCOUNTER — Telehealth: Payer: Self-pay | Admitting: *Deleted

## 2017-03-22 DIAGNOSIS — B009 Herpesviral infection, unspecified: Secondary | ICD-10-CM

## 2017-03-22 NOTE — Telephone Encounter (Signed)
[facility] pharmacy sent fax asking to clarify directions for Rx Valtrex 500 mg. Is patient taking 1 po daily? Or take 1 po twice daily 3-5 days for outbreak then daily as needed? Please advise

## 2017-03-23 MED ORDER — VALACYCLOVIR HCL 500 MG PO TABS: ORAL_TABLET | ORAL | 1 refills | Status: DC

## 2017-03-23 NOTE — Telephone Encounter (Signed)
Telephone call, reviewed options, will continue with acyclovir 200 mg daily and will take Valtrex 500 mg twice daily for 3-5 days as needed.  Please call   Valtrex  twice daily for 5 days as needed,  Dispense #90 with 1 refill.  (90 was $50, ok with pt)

## 2017-03-23 NOTE — Telephone Encounter (Signed)
Rx sent to pharmacy   

## 2017-04-05 ENCOUNTER — Telehealth (INDEPENDENT_AMBULATORY_CARE_PROVIDER_SITE_OTHER): Payer: Self-pay | Admitting: Orthopaedic Surgery

## 2017-04-05 NOTE — Telephone Encounter (Signed)
Patient wants to know if its possible to have a MRI per her conversation at her last office visit.

## 2017-04-05 NOTE — Telephone Encounter (Signed)
See message below °

## 2017-04-08 ENCOUNTER — Other Ambulatory Visit (INDEPENDENT_AMBULATORY_CARE_PROVIDER_SITE_OTHER): Payer: Self-pay

## 2017-04-08 DIAGNOSIS — M25562 Pain in left knee: Secondary | ICD-10-CM

## 2017-04-08 DIAGNOSIS — G8929 Other chronic pain: Secondary | ICD-10-CM

## 2017-04-08 DIAGNOSIS — M25511 Pain in right shoulder: Principal | ICD-10-CM

## 2017-04-19 ENCOUNTER — Other Ambulatory Visit: Payer: Self-pay | Admitting: Women's Health

## 2017-04-19 ENCOUNTER — Encounter: Payer: Self-pay | Admitting: Women's Health

## 2017-04-19 ENCOUNTER — Other Ambulatory Visit: Payer: Self-pay | Admitting: Sports Medicine

## 2017-04-19 ENCOUNTER — Encounter (INDEPENDENT_AMBULATORY_CARE_PROVIDER_SITE_OTHER): Payer: Self-pay | Admitting: Orthopaedic Surgery

## 2017-04-19 DIAGNOSIS — B009 Herpesviral infection, unspecified: Secondary | ICD-10-CM

## 2017-04-19 MED ORDER — ACYCLOVIR 200 MG PO CAPS: ORAL_CAPSULE | ORAL | 4 refills | Status: DC

## 2017-04-20 ENCOUNTER — Other Ambulatory Visit: Payer: Self-pay

## 2017-04-20 ENCOUNTER — Ambulatory Visit
Admission: RE | Admit: 2017-04-20 | Discharge: 2017-04-20 | Disposition: A | Discharge status: Home / Self Care | Payer: Medicare Other | Source: Ambulatory Visit | Attending: Orthopaedic Surgery | Admitting: Orthopaedic Surgery

## 2017-04-20 DIAGNOSIS — G8929 Other chronic pain: Secondary | ICD-10-CM

## 2017-04-20 DIAGNOSIS — M25511 Pain in right shoulder: Principal | ICD-10-CM

## 2017-04-20 DIAGNOSIS — M25562 Pain in left knee: Principal | ICD-10-CM

## 2017-04-27 ENCOUNTER — Encounter: Payer: Self-pay | Admitting: Gynecology

## 2017-04-29 ENCOUNTER — Encounter: Payer: Self-pay | Admitting: Women's Health

## 2017-04-29 ENCOUNTER — Ambulatory Visit (INDEPENDENT_AMBULATORY_CARE_PROVIDER_SITE_OTHER): Payer: Medicare Other | Admitting: Orthopaedic Surgery

## 2017-04-29 DIAGNOSIS — M7541 Impingement syndrome of right shoulder: Secondary | ICD-10-CM

## 2017-04-29 DIAGNOSIS — M25561 Pain in right knee: Secondary | ICD-10-CM | POA: Diagnosis not present

## 2017-04-29 NOTE — Progress Notes (Signed)
Office Visit Note   Patient: Rachel Shepherd           Date of Birth: 07/16/1951           MRN: [id number] Visit Date: 04/29/2017              Requested by: Rachel Shepherd, Rachel C, MD [address] [place], [place] [address] PCP: Rachel Shepherd, Rachel C, MD   Assessment & Plan: Visit Diagnoses:  1. Impingement syndrome of right shoulder   2. 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 She has severe arthropathy of the acromioclavicular joint. No full-thickness tears. 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  Overall she is doing very well and progressing and improving appropriately. Her shoulder is very functional. Going to get her back into physical therapy agrees with physical therapy. Follow-up with me as needed.  Follow-Up Instructions: Return if symptoms worsen or fail to improve.   Orders:  No orders of the defined types were placed in this encounter.  No orders of the defined types were placed in this encounter.     Procedures: No procedures performed   Clinical Data: No additional findings.   Subjective: No chief complaint on file.   Rachel Shepherd comes back today to review her MRI of the knee and shoulder. Overall she is getting better. She is states that she does have a little discomfort when she is reaching out the side. Her knee is aching at times.    Review of Systems  Constitutional: Negative.   HENT: Negative.   Eyes: Negative.   Respiratory: Negative.   Cardiovascular: Negative.   Endocrine: Negative.   Musculoskeletal: Negative.   Neurological: Negative.   Hematological: Negative.   Psychiatric/Behavioral: Negative.   All other systems reviewed and are negative.    Objective: Vital Signs: There were no vitals taken for this visit.  Physical Exam  Constitutional: She is oriented to person, place, and time. She appears  well-developed and well-nourished.  Pulmonary/Chest: Effort normal.  Neurological: She is alert and oriented to person, place, and time.  Skin: Skin is warm. Capillary refill takes less than 2 seconds.  Psychiatric: She has a normal mood and affect. Her behavior is normal. Judgment and thought content normal.  Nursing note and vitals reviewed.   Ortho Exam Right shoulder exam shows very good function and strength overall. No functional deficits. Right knee exam is stable. Mild tenderness along the MCL. Specialty Comments:  No specialty comments available.  Imaging: No results found.   PMFS History: Patient Active Problem List   Diagnosis Date Noted  . Acute pain of right knee 04/29/2017  . Impingement syndrome of right shoulder 04/29/2017  . Hypertension   . SUI (stress urinary incontinence, female)   . Atrophic vaginitis   . HSV-2 (herpes simplex virus 2) infection   . Dysplasia of cervix, low grade (CIN 1)   . High risk HPV infection   . PAIN IN JOINT, SITE UNSPECIFIED 12/02/2010  . SLEEP DISORDER 12/02/2010  . MUSCLE PAIN 12/09/2009  . POSTMENOPAUSAL STATUS 11/21/2009  . CHEST DISCOMFORT 09/29/2009  . COLONIC POLYPS, HX OF 07/09/2009  . CHRONIC GRANULOMATOUS DISEASE 04/16/2008   Past Medical History:  Diagnosis Date  . Atrophic vaginitis   . Dysplasia of cervix, 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   HPV-HIGH RISK  . 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  . HSV-2 (  herpes simplex virus 2) infection   . Hypertension   . SUI (stress urinary incontinence, female)     Family History  Problem Relation Age of Onset  . Heart disease Mother        PACE MAKER  . Breast cancer Mother        Age 73  . Stroke Paternal Grandmother   . Heart disease Paternal Grandmother     Past Surgical History:  Procedure Laterality Date  . COLPOSCOPY    . CYST REMOVED RIGHT CHEEK    . 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 TUBAL LIGATION    . WRIST SURGERY     Social History   Occupational  History  . Not on file.   Social History Main Topics  . Smoking status: Never Smoker  . Smokeless tobacco: Never Used  . Alcohol use 8.4 oz/week    14 Standard drinks or equivalent per week  . Drug use: No  . Sexual activity: No     Comment: INTERCOURSE AGE 64, SEXUAL PARTNERS 5+++++++++++-+

## 2017-06-07 ENCOUNTER — Encounter: Payer: Medicare Other | Admitting: Women's Health

## 2017-06-29 ENCOUNTER — Ambulatory Visit (INDEPENDENT_AMBULATORY_CARE_PROVIDER_SITE_OTHER): Payer: Medicare Other | Admitting: Women's Health

## 2017-06-29 ENCOUNTER — Encounter: Payer: Self-pay | Admitting: Women's Health

## 2017-06-29 VITALS — BP 132/80 | Ht 62.75 in | Wt 123.0 lb

## 2017-06-29 DIAGNOSIS — B009 Herpesviral infection, unspecified: Secondary | ICD-10-CM

## 2017-06-29 DIAGNOSIS — Z124 Encounter for screening for malignant neoplasm of cervix: Secondary | ICD-10-CM | POA: Diagnosis not present

## 2017-06-29 DIAGNOSIS — Z779 Other contact with and (suspected) exposures hazardous to health: Secondary | ICD-10-CM | POA: Diagnosis not present

## 2017-06-29 DIAGNOSIS — Z79890 Hormone replacement therapy (postmenopausal): Secondary | ICD-10-CM

## 2017-06-29 DIAGNOSIS — Z01419 Encounter for gynecological examination (general) (routine) without abnormal findings: Secondary | ICD-10-CM

## 2017-06-29 DIAGNOSIS — Z8742 Personal history of other diseases of the female genital tract: Secondary | ICD-10-CM

## 2017-06-29 MED ORDER — ESTRADIOL 0.5 MG PO TABS: 0.5000 mg | ORAL_TABLET | Freq: Every day | ORAL | 4 refills | Status: DC

## 2017-06-29 MED ORDER — MEDROXYPROGESTERONE ACETATE 10 MG PO TABS: 10.0000 mg | ORAL_TABLET | Freq: Every day | ORAL | 12 refills | Status: DC

## 2017-06-29 MED ORDER — ACYCLOVIR 200 MG PO CAPS: ORAL_CAPSULE | ORAL | 4 refills | Status: DC

## 2017-06-29 NOTE — Progress Notes (Signed)
Rachel Shepherd 10/04/1951 [id number]    History:    Presents for breast, pelvic exam and Pap. CIN-1 2011. HSV history were all breaks. Postmenopausal with no bleeding on HRT decreasing dose. 04/2015 normal DEXA spine +1.1. Vaccines current. History of colon polyp has had follow-up.  Not sexually active years, husband erectile dysfunction.  Past medical history, past surgical history, family history and social history were all reviewed and documented in the EPIC chart. Continues to work part-time. Is doing more travel. Husband 20 years older and continues to work full-time.  ROS:  A ROS was performed and pertinent positives and negatives are included.  Exam:  Vitals:   06/29/17 1226  BP: 132/80  Weight: 123 lb (55.8 kg)  Height: 5' 2.75" (1.594 m)   Body mass index is 21.96 kg/m.   General appearance:  Normal Thyroid:  Symmetrical, normal in size, without palpable masses or nodularity. Respiratory  Auscultation:  Clear without wheezing or rhonchi Cardiovascular  Auscultation:  Regular rate, without rubs, murmurs or gallops  Edema/varicosities:  Not grossly evident Abdominal  Soft,nontender, without masses, guarding or rebound.  Liver/spleen:  No organomegaly noted  Hernia:  None appreciated  Skin  Inspection:  Grossly normal   Breasts: Examined lying and sitting.     Right: Without masses, retractions, discharge or axillary adenopathy.     Left: Without masses, retractions, discharge or axillary adenopathy. Gentitourinary   Inguinal/mons:  Normal without inguinal adenopathy  External genitalia:  Normal  BUS/Urethra/Skene's glands:  Normal  Vagina:  Atrophic  Cervix:  Normal  Uterus:  normal in size, shape and contour.  Midline and mobile  Adnexa/parametria:     Rt: Without masses or tenderness.   Lt: Without masses or tenderness.  Anus and perineum: Normal  Digital rectal exam: Normal sphincter tone without palpated masses or tenderness  Assessment/Plan:  66 y.o. MWF G0  for breast, pelvic and Pap.  Postmenopausal on HRT with no bleeding 2011 CIN-1 HSV rare outbreaks  Plan: Acyclovir 200 mg 5 times daily when necessary prescription, proper use given and reviewed. Estradiol 0.5 will try taking half tablet daily with Provera 5 mg day one through 12 of each month. Instructed to call if problems with decreased. SBE's, exercise, calcium rich diet, vitamin D 2000 daily encouraged. Continue regular exercise routine, home safety, fall prevention discussed. Pap.    Harrington Challengerancy J Nasira Janus[facility] CenterWHNP, 1:28 PM 06/29/2017

## 2017-06-29 NOTE — Patient Instructions (Signed)
Health Maintenance for Postmenopausal Women Menopause is a normal process in which your reproductive ability comes to an end. This process happens gradually over a span of months to years, usually between the ages of 22 and 9. Menopause is complete when you have missed 12 consecutive menstrual periods. It is important to talk with your health care provider about some of the most common conditions that affect postmenopausal women, such as heart disease, cancer, and bone loss (osteoporosis). Adopting a healthy lifestyle and getting preventive care can help to promote your health and wellness. Those actions can also lower your chances of developing some of these common conditions. What should I know about menopause? During menopause, you may experience a number of symptoms, such as:  Moderate-to-severe hot flashes.  Night sweats.  Decrease in sex drive.  Mood swings.  Headaches.  Tiredness.  Irritability.  Memory problems.  Insomnia.  Choosing to treat or not to treat menopausal changes is an individual decision that you make with your health care provider. What should I know about hormone replacement therapy and supplements? Hormone therapy products are effective for treating symptoms that are associated with menopause, such as hot flashes and night sweats. Hormone replacement carries certain risks, especially as you become older. If you are thinking about using estrogen or estrogen with progestin treatments, discuss the benefits and risks with your health care provider. What should I know about heart disease and stroke? Heart disease, heart attack, and stroke become more likely as you age. This may be due, in part, to the hormonal changes that your body experiences during menopause. These can affect how your body processes dietary fats, triglycerides, and cholesterol. Heart attack and stroke are both medical emergencies. There are many things that you can do to help prevent heart disease  and stroke:  Have your blood pressure checked at least every 1-2 years. High blood pressure causes heart disease and increases the risk of stroke.  If you are 53-22 years old, ask your health care provider if you should take aspirin to prevent a heart attack or a stroke.  Do not use any tobacco products, including cigarettes, chewing tobacco, or electronic cigarettes. If you need help quitting, ask your health care provider.  It is important to eat a healthy diet and maintain a healthy weight. ? Be sure to include plenty of vegetables, fruits, low-fat dairy products, and lean protein. ? Avoid eating foods that are high in solid fats, added sugars, or salt (sodium).  Get regular exercise. This is one of the most important things that you can do for your health. ? Try to exercise for at least 150 minutes each week. The type of exercise that you do should increase your heart rate and make you sweat. This is known as moderate-intensity exercise. ? Try to do strengthening exercises at least twice each week. Do these in addition to the moderate-intensity exercise.  Know your numbers.Ask your health care provider to check your cholesterol and your blood glucose. Continue to have your blood tested as directed by your health care provider.  What should I know about cancer screening? There are several types of cancer. Take the following steps to reduce your risk and to catch any cancer development as early as possible. Breast Cancer  Practice breast self-awareness. ? This means understanding how your breasts normally appear and feel. ? It also means doing regular breast self-exams. Let your health care provider know about any changes, no matter how small.  If you are 40  or older, have a clinician do a breast exam (clinical breast exam or CBE) every year. Depending on your age, family history, and medical history, it may be recommended that you also have a yearly breast X-ray (mammogram).  If you  have a family history of breast cancer, talk with your health care provider about genetic screening.  If you are at high risk for breast cancer, talk with your health care provider about having an MRI and a mammogram every year.  Breast cancer (BRCA) gene test is recommended for women who have family members with BRCA-related cancers. Results of the assessment will determine the need for genetic counseling and BRCA1 and for BRCA2 testing. BRCA-related cancers include these types: ? Breast. This occurs in males or females. ? Ovarian. ? Tubal. This may also be called fallopian tube cancer. ? Cancer of the abdominal or pelvic lining (peritoneal cancer). ? Prostate. ? Pancreatic.  Cervical, Uterine, and Ovarian Cancer Your health care provider may recommend that you be screened regularly for cancer of the pelvic organs. These include your ovaries, uterus, and vagina. This screening involves a pelvic exam, which includes checking for microscopic changes to the surface of your cervix (Pap test).  For women ages 21-65, health care providers may recommend a pelvic exam and a Pap test every three years. For women ages 79-65, they may recommend the Pap test and pelvic exam, combined with testing for human papilloma virus (HPV), every five years. Some types of HPV increase your risk of cervical cancer. Testing for HPV may also be done on women of any age who have unclear Pap test results.  Other health care providers may not recommend any screening for nonpregnant women who are considered low risk for pelvic cancer and have no symptoms. Ask your health care provider if a screening pelvic exam is right for you.  If you have had past treatment for cervical cancer or a condition that could lead to cancer, you need Pap tests and screening for cancer for at least 20 years after your treatment. If Pap tests have been discontinued for you, your risk factors (such as having a new sexual partner) need to be  reassessed to determine if you should start having screenings again. Some women have medical problems that increase the chance of getting cervical cancer. In these cases, your health care provider may recommend that you have screening and Pap tests more often.  If you have a family history of uterine cancer or ovarian cancer, talk with your health care provider about genetic screening.  If you have vaginal bleeding after reaching menopause, tell your health care provider.  There are currently no reliable tests available to screen for ovarian cancer.  Lung Cancer Lung cancer screening is recommended for adults 69-62 years old who are at high risk for lung cancer because of a history of smoking. A yearly low-dose CT scan of the lungs is recommended if you:  Currently smoke.  Have a history of at least 30 pack-years of smoking and you currently smoke or have quit within the past 15 years. A pack-year is smoking an average of one pack of cigarettes per day for one year.  Yearly screening should:  Continue until it has been 15 years since you quit.  Stop if you develop a health problem that would prevent you from having lung cancer treatment.  Colorectal Cancer  This type of cancer can be detected and can often be prevented.  Routine colorectal cancer screening usually begins at  age 42 and continues through age 45.  If you have risk factors for colon cancer, your health care provider may recommend that you be screened at an earlier age.  If you have a family history of colorectal cancer, talk with your health care provider about genetic screening.  Your health care provider may also recommend using home test kits to check for hidden blood in your stool.  A small camera at the end of a tube can be used to examine your colon directly (sigmoidoscopy or colonoscopy). This is done to check for the earliest forms of colorectal cancer.  Direct examination of the colon should be repeated every  5-10 years until age 71. However, if early forms of precancerous polyps or small growths are found or if you have a family history or genetic risk for colorectal cancer, you may need to be screened more often.  Skin Cancer  Check your skin from head to toe regularly.  Monitor any moles. Be sure to tell your health care provider: ? About any new moles or changes in moles, especially if there is a change in a mole's shape or color. ? If you have a mole that is larger than the size of a pencil eraser.  If any of your family members has a history of skin cancer, especially at a Cataldo Cosgriff age, talk with your health care provider about genetic screening.  Always use sunscreen. Apply sunscreen liberally and repeatedly throughout the day.  Whenever you are outside, protect yourself by wearing long sleeves, pants, a wide-brimmed hat, and sunglasses.  What should I know about osteoporosis? Osteoporosis is a condition in which bone destruction happens more quickly than new bone creation. After menopause, you may be at an increased risk for osteoporosis. To help prevent osteoporosis or the bone fractures that can happen because of osteoporosis, the following is recommended:  If you are 46-71 years old, get at least 1,000 mg of calcium and at least 600 mg of vitamin D per day.  If you are older than age 55 but younger than age 65, get at least 1,200 mg of calcium and at least 600 mg of vitamin D per day.  If you are older than age 54, get at least 1,200 mg of calcium and at least 800 mg of vitamin D per day.  Smoking and excessive alcohol intake increase the risk of osteoporosis. Eat foods that are rich in calcium and vitamin D, and do weight-bearing exercises several times each week as directed by your health care provider. What should I know about how menopause affects my mental health? Depression may occur at any age, but it is more common as you become older. Common symptoms of depression  include:  Low or sad mood.  Changes in sleep patterns.  Changes in appetite or eating patterns.  Feeling an overall lack of motivation or enjoyment of activities that you previously enjoyed.  Frequent crying spells.  Talk with your health care provider if you think that you are experiencing depression. What should I know about immunizations? It is important that you get and maintain your immunizations. These include:  Tetanus, diphtheria, and pertussis (Tdap) booster vaccine.  Influenza every year before the flu season begins.  Pneumonia vaccine.  Shingles vaccine.  Your health care provider may also recommend other immunizations. This information is not intended to replace advice given to you by your health care provider. Make sure you discuss any questions you have with your health care provider. Document Released: 01/21/2006  Document Revised: 06/18/2016 Document Reviewed: 09/02/2015 Elsevier Interactive Patient Education  2018 [employer]  

## 2017-06-29 NOTE — Addendum Note (Signed)
Addended by: Kem ParkinsonBARNES, Primus Gritton on: 06/29/2017 02:16 PM   Modules accepted: Orders

## 2017-06-30 LAB — PAP IG W/ RFLX HPV ASCU

## 2017-10-13 ENCOUNTER — Ambulatory Visit (INDEPENDENT_AMBULATORY_CARE_PROVIDER_SITE_OTHER): Payer: Medicare Other | Admitting: Orthopaedic Surgery

## 2017-10-13 DIAGNOSIS — M25512 Pain in left shoulder: Secondary | ICD-10-CM | POA: Insufficient documentation

## 2017-10-13 DIAGNOSIS — M25511 Pain in right shoulder: Secondary | ICD-10-CM | POA: Insufficient documentation

## 2017-10-13 MED ORDER — TIZANIDINE HCL 4 MG PO TABS: 4.0000 mg | ORAL_TABLET | Freq: Four times a day (QID) | ORAL | 2 refills | Status: DC | PRN

## 2017-10-13 MED ORDER — PREDNISONE 10 MG (21) PO TBPK: ORAL_TABLET | ORAL | 0 refills | Status: DC

## 2017-10-13 NOTE — Progress Notes (Signed)
Office Visit Note   Patient: Rachel Shepherd           Date of Birth: 01-05-1951           MRN: [id number] Visit Date: 10/13/2017              Requested by: Eartha Inch, MD [address] [place], [place] [address] PCP: Eartha Inch, MD   Assessment & Plan: Visit Diagnoses:  1. Acute pain of right shoulder   2. Acute pain of left shoulder     Plan: Impression is right shoulder periscapular trigger point and left shoulder pain possible strain of the deltoid insertion on the acromion.  Prescription for prednisone and Zanaflex.  Ice and heat as needed.  Avoid upper extremity exercising for a couple weeks and then gradually restart.  Patient not presenting classically with radicular symptoms although we do know she has degenerative disc disease in her cervical spine.  Questions encouraged and answered. Total face to face encounter time was greater than 25 minutes and over half of this time was spent in counseling and/or coordination of care.  Follow-Up Instructions: Return if symptoms worsen or fail to improve.   Orders:  No orders of the defined types were placed in this encounter.  Meds ordered this encounter  Medications  . tiZANidine (ZANAFLEX) 4 MG tablet    Sig: Take 1 tablet (4 mg total) by mouth every 6 (six) hours as needed for muscle spasms.    Dispense:  30 tablet    Refill:  2  . predniSONE (STERAPRED UNI-PAK 21 TAB) 10 MG (21) TBPK tablet    Sig: Take as directed    Dispense:  21 tablet    Refill:  0      Procedures: No procedures performed   Clinical Data: No additional findings.   Subjective: No chief complaint on file.   Patient is a 66 year old healthy female who comes in today with right periscapular pain and left shoulder pain over the lateral edge of the acromion.  She denies any injuries or numbness and tingling.  Denies any neck pain or true radicular symptoms.  Pain is worse with activity and use of her arms.    Review of Systems    Constitutional: Negative.   HENT: Negative.   Eyes: Negative.   Respiratory: Negative.   Cardiovascular: Negative.   Endocrine: Negative.   Musculoskeletal: Negative.   Neurological: Negative.   Hematological: Negative.   Psychiatric/Behavioral: Negative.   All other systems reviewed and are negative.    Objective: Vital Signs: There were no vitals taken for this visit.  Physical Exam  Constitutional: She is oriented to person, place, and time. She appears well-developed and well-nourished.  HENT:  Head: Normocephalic and atraumatic.  Eyes: EOM are normal.  Neck: Neck supple.  Pulmonary/Chest: Effort normal.  Abdominal: Soft.  Neurological: She is alert and oriented to person, place, and time.  Skin: Skin is warm. Capillary refill takes less than 2 seconds.  Psychiatric: She has a normal mood and affect. Her behavior is normal. Judgment and thought content normal.  Nursing note and vitals reviewed.   Ortho Exam Right shoulder exam shows full range of motion and strength and sensation.  She has mild discomfort on the medial aspect of the superior border of the scapula.  There is no crepitus or popping or snapping.  Left shoulder exam shows no swelling or lesions or masses.  She is slightly tender over the lateral acromion edge.  No real impingement signs.  No focal findings otherwise. Specialty Comments:  No specialty comments available.  Imaging: No results found.   PMFS History: Patient Active Problem List   Diagnosis Date Noted  . Acute pain of right shoulder 10/13/2017  . Acute pain of left shoulder 10/13/2017  . Acute pain of right knee 04/29/2017  . Impingement syndrome of right shoulder 04/29/2017  . Hypertension   . SUI (stress urinary incontinence, female)   . Atrophic vaginitis   . HSV-2 (herpes simplex virus 2) infection   . Dysplasia of cervix, low grade (CIN 1)   . High risk HPV infection   . PAIN IN JOINT, SITE UNSPECIFIED 12/02/2010  . SLEEP  DISORDER 12/02/2010  . MUSCLE PAIN 12/09/2009  . POSTMENOPAUSAL STATUS 11/21/2009  . CHEST DISCOMFORT 09/29/2009  . COLONIC POLYPS, HX OF 07/09/2009  . CHRONIC GRANULOMATOUS DISEASE 04/16/2008   Past Medical History:  Diagnosis Date  . Atrophic vaginitis   . Dysplasia of cervix, 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   HPV-HIGH RISK  . 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  . HSV-2 (herpes simplex virus 2) infection   . Hypertension   . SUI (stress urinary incontinence, female)     Family History  Problem Relation Age of Onset  . Heart disease Mother        PACE MAKER  . Breast cancer Mother        Age 68  . Stroke Paternal Grandmother   . Heart disease Paternal Grandmother     Past Surgical History:  Procedure Laterality Date  . COLPOSCOPY    . CYST REMOVED RIGHT CHEEK    . 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 TUBAL LIGATION    . WRIST SURGERY     Social History   Occupational History  . Not on file.   Social History Main Topics  . Smoking status: Never Smoker  . Smokeless tobacco: Never Used  . Alcohol use 8.4 oz/week    14 Standard drinks or equivalent per week  . Drug use: No  . Sexual activity: No     Comment: INTERCOURSE AGE 51, SEXUAL PARTNERS 5+++++++++++-+

## 2017-12-02 IMAGING — MR MR SHOULDER*R* W/O CM
4 of 5 series · 23 of 40 positions shown · non-contrast
Comparison: None.

CLINICAL DATA: Right shoulder pain. Injured while skiing December 2016. Anterior pain.

EXAM:
MRI OF THE RIGHT SHOULDER WITHOUT CONTRAST
TECHNIQUE: Multiplanar, multisequence MR imaging of the shoulder was performed.
No intravenous contrast was administered.

[Series 6: T2 fat-sat · axial · right · 3.0mm · 0.55mm/px · z∈[-52,+35]mm · 8 of 25 slices shown (1 of 3)]
[im 1/25]
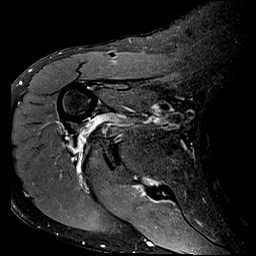
[im 4/25]
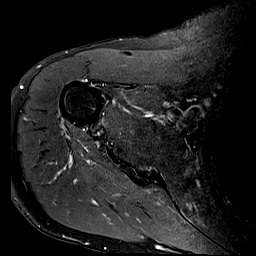
[im 7/25]
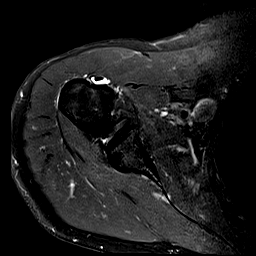
[im 11/25]
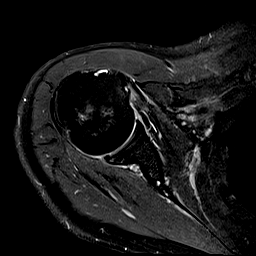
[im 14/25]
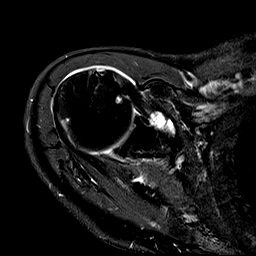
[im 18/25]
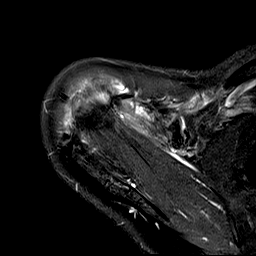
[im 21/25]
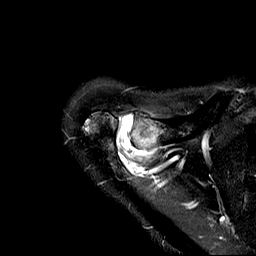
[im 25/25]
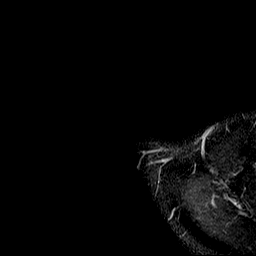

[Series 7: T2 fat-sat · oblique · right · 3.0mm · 0.44mm/px · 4 of 21 slices shown (2 of 3)]
[im 1/21]
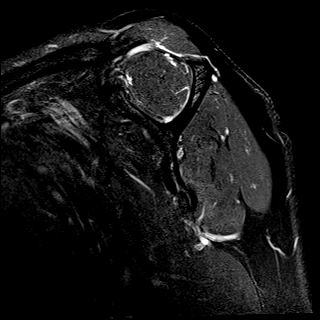
[im 3/21]
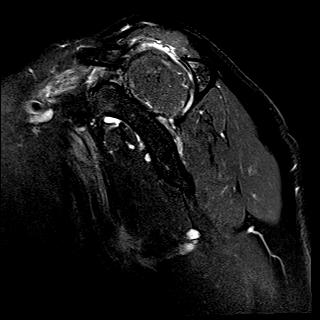
[im 12/21]
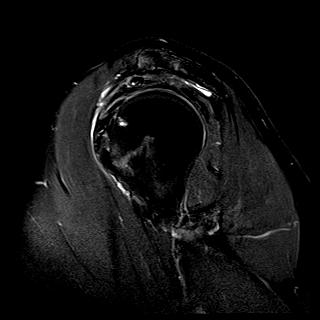
[im 18/21]
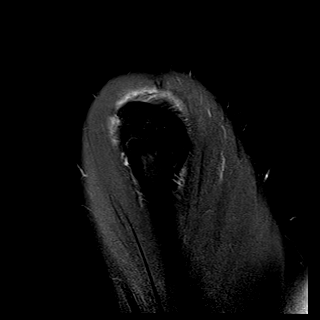

[Series 9: PD · oblique · right · 3.0mm · 0.22mm/px · 8 of 21 slices shown]
[im 1/21]
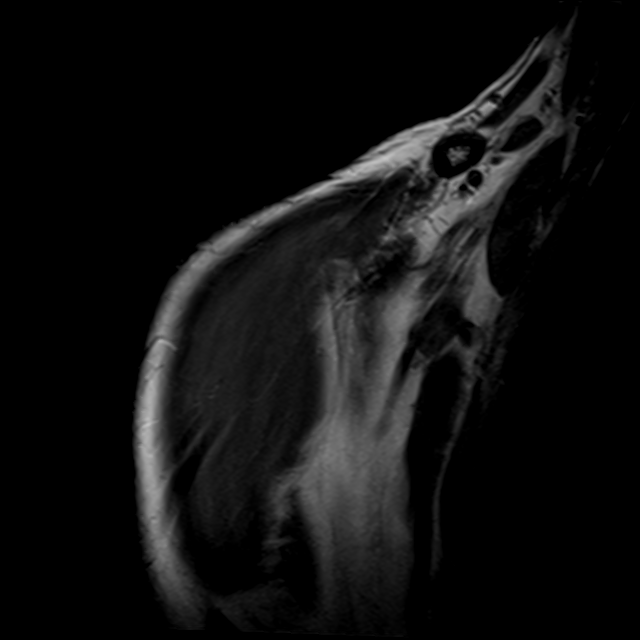
[im 3/21]
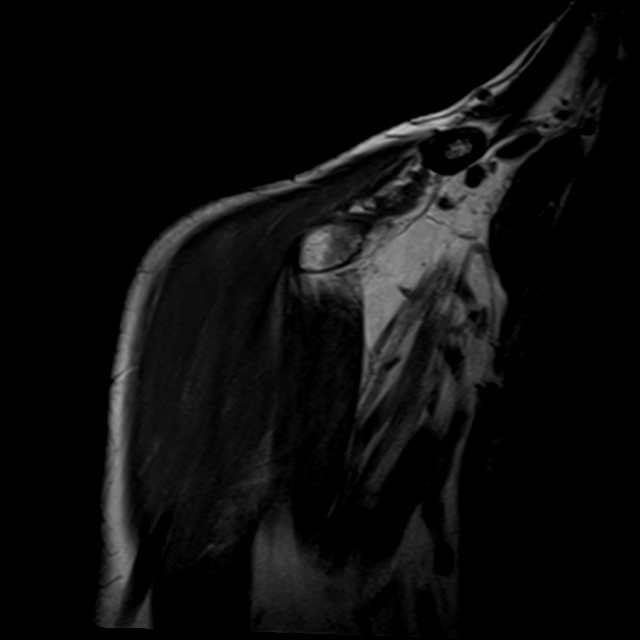
[im 6/21]
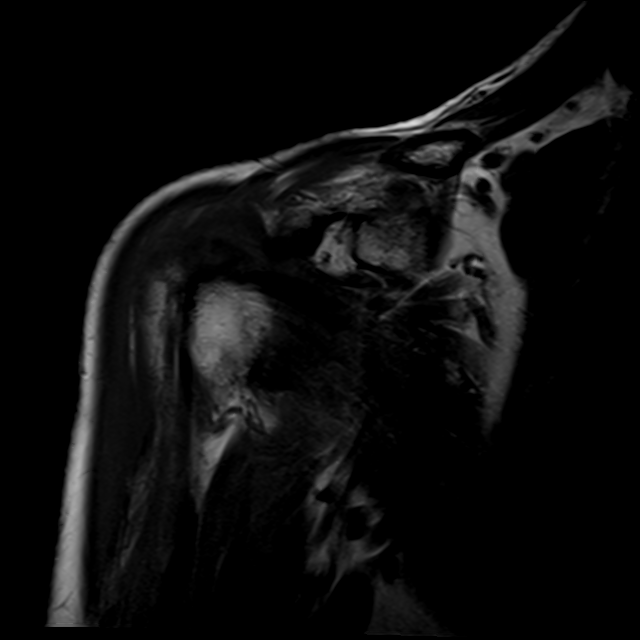
[im 9/21]
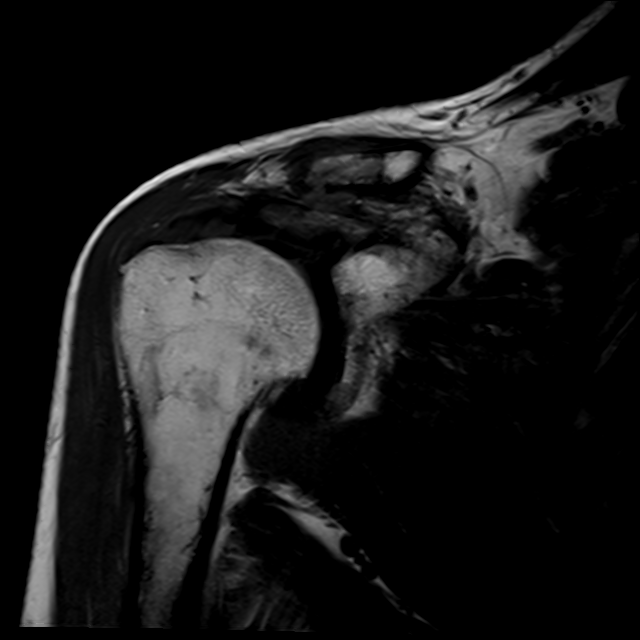
[im 12/21]
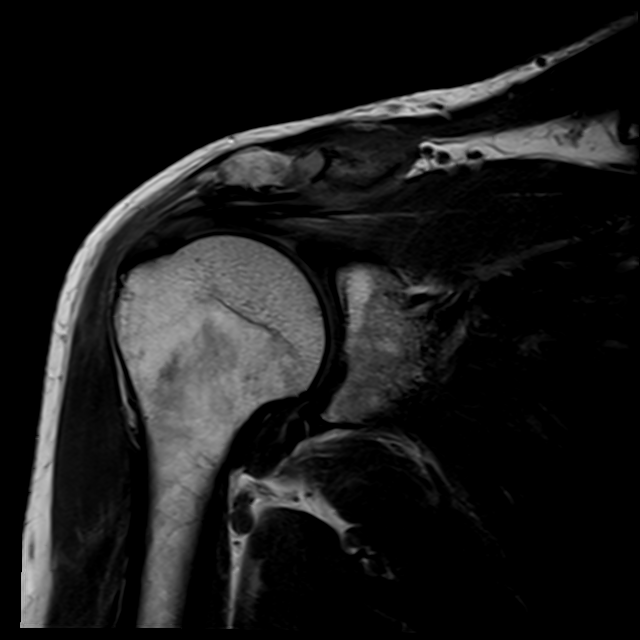
[im 15/21]
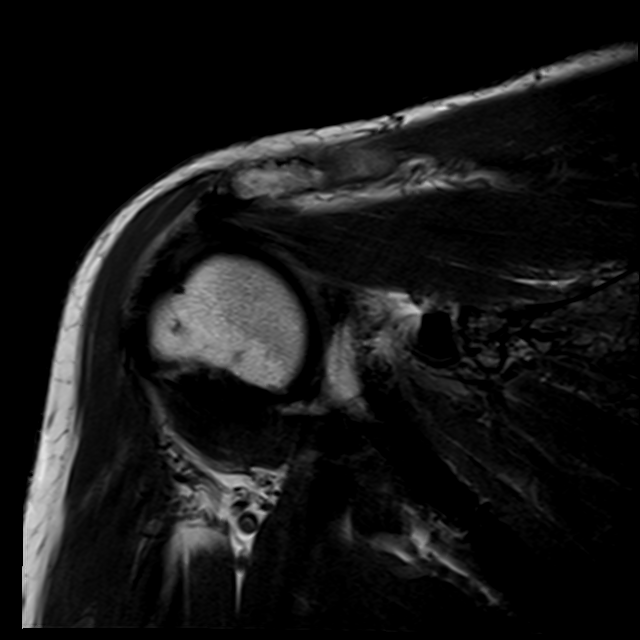
[im 18/21]
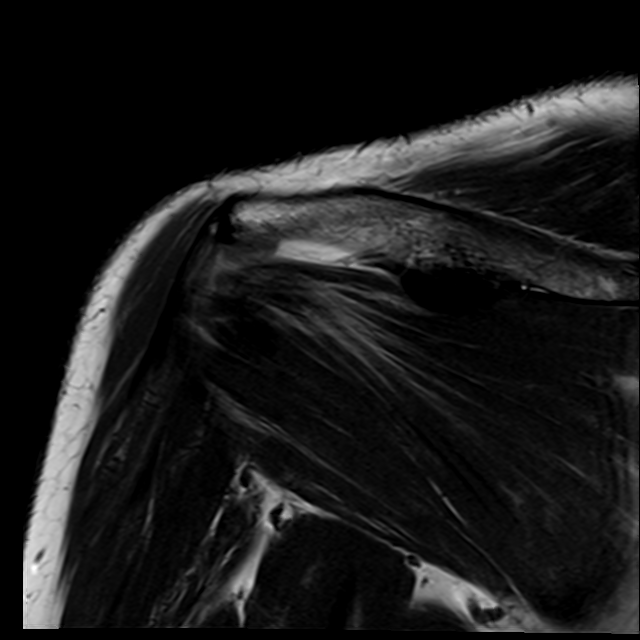
[im 21/21]
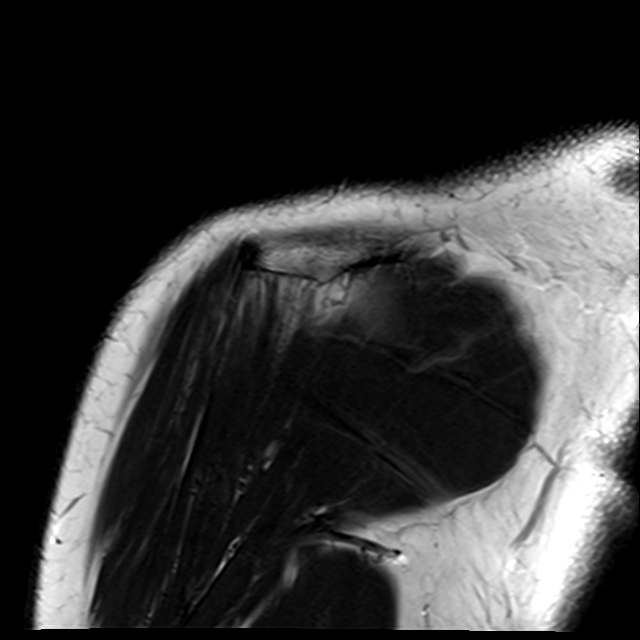

[Series 10: T2 fat-sat · oblique · right · 3.0mm · 0.27mm/px · 3 of 21 slices shown (3 of 3)]
[im 3/21]
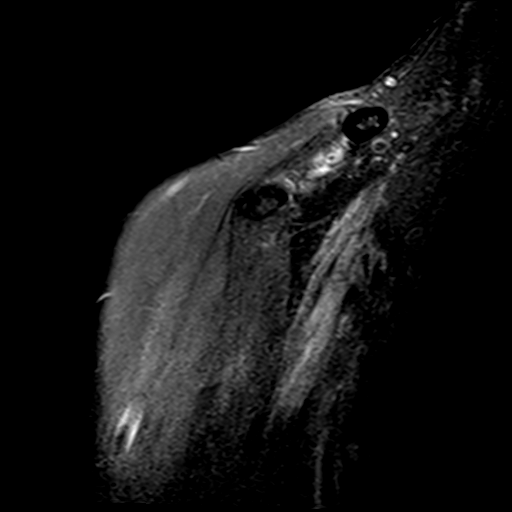
[im 12/21]
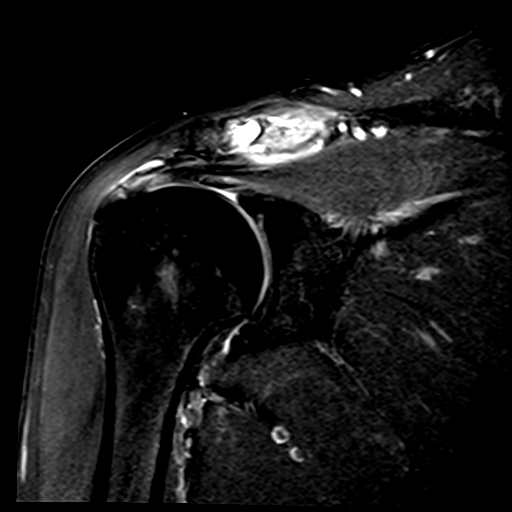
[im 18/21]
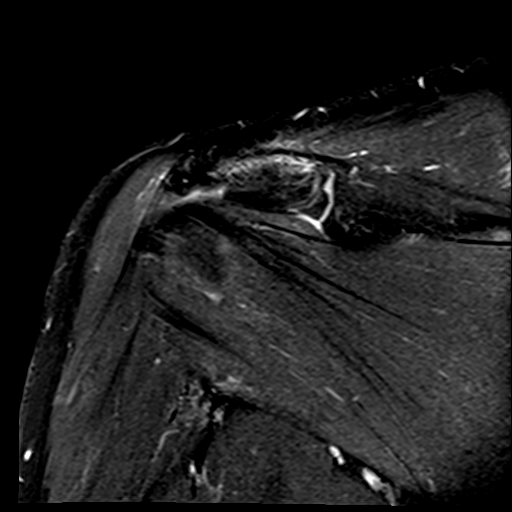

[23 of 40 positions shown; findings below may reference images not displayed]

FINDINGS: Rotator cuff: Severe tendinosis of the supraspinatus tendon with an
insertional interstitial tear with possible articular surface
extension. Mild tendinosis of the infraspinatus tendon. Teres minor
tendon is intact. Mild tendinosis of the subscapularis tendon.

Muscles: No atrophy or fatty replacement of nor abnormal signal
within, the muscles of the rotator cuff.

Biceps long head:  Intact.

Acromioclavicular Joint: Severe arthropathy of the acromioclavicular
joint with a small joint effusion and marrow edema in the distal
clavicle. Small erosion of the articular surface of the distal
clavicle likely reflecting posttraumatic osteolysis. Type II
acromion. Trace amount of subacromial/subdeltoid bursal fluid.

Glenohumeral Joint: No joint effusion.  No focal chondral defect.

Labrum: Grossly intact, but evaluation is limited by lack of
intraarticular fluid.

Bones: No other marrow signal abnormality. No fracture or
dislocation.

Other: No fluid collection or hematoma.
IMPRESSION: 1. Severe tendinosis of the supraspinatus tendon with an insertional
interstitial tear with possible articular surface extension.
2. Mild tendinosis of the infraspinatus tendon.
3. Mild tendinosis of the subscapularis tendon.
4. Severe arthropathy of the acromioclavicular joint with a small
joint effusion and marrow edema in the distal clavicle. Small
erosion of the articular surface of the distal clavicle likely
reflecting posttraumatic osteolysis.

## 2018-02-21 ENCOUNTER — Ambulatory Visit (INDEPENDENT_AMBULATORY_CARE_PROVIDER_SITE_OTHER): Payer: Medicare Other

## 2018-02-21 ENCOUNTER — Encounter (INDEPENDENT_AMBULATORY_CARE_PROVIDER_SITE_OTHER): Payer: Self-pay | Admitting: Orthopaedic Surgery

## 2018-02-21 ENCOUNTER — Ambulatory Visit (INDEPENDENT_AMBULATORY_CARE_PROVIDER_SITE_OTHER): Payer: Medicare Other | Admitting: Orthopaedic Surgery

## 2018-02-21 DIAGNOSIS — G8929 Other chronic pain: Secondary | ICD-10-CM

## 2018-02-21 DIAGNOSIS — M25562 Pain in left knee: Secondary | ICD-10-CM

## 2018-02-21 MED ORDER — BUPIVACAINE HCL 0.5 % IJ SOLN
2.0000 mL | INTRAMUSCULAR | Status: AC | PRN
  Administered 2018-02-21: 2 mL via INTRA_ARTICULAR

## 2018-02-21 MED ORDER — METHYLPREDNISOLONE ACETATE 40 MG/ML IJ SUSP
40.0000 mg | INTRAMUSCULAR | Status: AC | PRN
  Administered 2018-02-21: 40 mg via INTRA_ARTICULAR

## 2018-02-21 MED ORDER — LIDOCAINE HCL 1 % IJ SOLN
2.0000 mL | INTRAMUSCULAR | Status: AC | PRN
  Administered 2018-02-21: 2 mL

## 2018-02-21 NOTE — Progress Notes (Signed)
Office Visit Note   Patient: Rachel Shepherd           Date of Birth: 1951-06-18           MRN: [id number] Visit Date: 02/21/2018              Requested by: Eartha Inch, MD [address] [place], [place] [address] PCP: Eartha Inch, MD   Assessment & Plan: Visit Diagnoses:  1. Chronic pain of left knee     Plan: Impression is left knee pain suspect degenerative medial meniscal tear versus bony contusion.  Cortisone injection was performed today to see if this will give her some relief.  If she is not better over the next 2-4 weeks patient instructed to give [place] a call so that we can order an MRI to look into it further.  Otherwise follow-up as needed.  Follow-Up Instructions: Return if symptoms worsen or fail to improve.   Orders:  Orders Placed This Encounter  Procedures  . XR KNEE 3 VIEW LEFT   No orders of the defined types were placed in this encounter.     Procedures: Large Joint Inj: L knee on 02/21/2018 3:47 PM Details: 22 G needle Medications: 2 mL bupivacaine 0.5 %; 2 mL lidocaine 1 %; 40 mg methylPREDNISolone acetate 40 MG/ML Outcome: tolerated well, no immediate complications Patient was prepped and draped in the usual sterile fashion.       Clinical Data: No additional findings.   Subjective: Chief Complaint  Patient presents with  . Left Knee - Pain    Rachel Shepherd comes in today for a new problem of chronic left knee pain for 6 weeks.  He injured it skiing earlier this year.  She endorses medial knee pain.  She denies any locking or mechanical symptoms.  She does endorse some stiffness.  Denies any joint effusion.  Denies any numbness and tingling.    Review of Systems  Constitutional: Negative.   HENT: Negative.   Eyes: Negative.   Respiratory: Negative.   Cardiovascular: Negative.   Endocrine: Negative.   Musculoskeletal: Negative.   Neurological: Negative.   Hematological: Negative.   Psychiatric/Behavioral: Negative.   All other  systems reviewed and are negative.    Objective: Vital Signs: There were no vitals taken for this visit.  Physical Exam  Constitutional: She is oriented to person, place, and time. She appears well-developed and well-nourished.  Pulmonary/Chest: Effort normal.  Neurological: She is alert and oriented to person, place, and time.  Skin: Skin is warm. Capillary refill takes less than 2 seconds.  Psychiatric: She has a normal mood and affect. Her behavior is normal. Judgment and thought content normal.  Nursing note and vitals reviewed.   Ortho Exam Left knee exam shows no joint effusion.  She does have medial joint line tenderness.  Collaterals and cruciates are stable.  Preserved joint range of motion. Specialty Comments:  No specialty comments available.  Imaging: Xr Knee 3 View Left  Result Date: 02/21/2018 No acute or structural abnormalities    PMFS History: Patient Active Problem List   Diagnosis Date Noted  . Acute pain of right shoulder 10/13/2017  . Acute pain of left shoulder 10/13/2017  . Acute pain of right knee 04/29/2017  . Impingement syndrome of right shoulder 04/29/2017  . Hypertension   . SUI (stress urinary incontinence, female)   . Atrophic vaginitis   . HSV-2 (herpes simplex virus 2) infection   . Dysplasia of cervix, low grade (CIN  1)   . High risk HPV infection   . PAIN IN JOINT, SITE UNSPECIFIED 12/02/2010  . SLEEP DISORDER 12/02/2010  . MUSCLE PAIN 12/09/2009  . POSTMENOPAUSAL STATUS 11/21/2009  . CHEST DISCOMFORT 09/29/2009  . COLONIC POLYPS, HX OF 07/09/2009  . CHRONIC GRANULOMATOUS DISEASE 04/16/2008   Past Medical History:  Diagnosis Date  . Atrophic vaginitis   . Dysplasia of cervix, 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   HPV-HIGH RISK  . 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  . HSV-2 (herpes simplex virus 2) infection   . Hypertension   . SUI (stress urinary incontinence, female)     Family History  Problem Relation Age of Onset  . Heart disease  Mother        PACE MAKER  . Breast cancer Mother        Age 69  . Stroke Paternal Grandmother   . Heart disease Paternal Grandmother     Past Surgical History:  Procedure Laterality Date  . COLPOSCOPY    . CYST REMOVED RIGHT CHEEK    . 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 TUBAL LIGATION    . WRIST SURGERY     Social History   Occupational History  . Not on file  Tobacco Use  . Smoking status: Never Smoker  . Smokeless tobacco: Never Used  Substance and Sexual Activity  . Alcohol use: Yes    Alcohol/week: 8.4 oz    Types: 14 Standard drinks or equivalent per week  . Drug use: No  . Sexual activity: No    Birth control/protection: Post-menopausal    Comment: INTERCOURSE AGE 43, SEXUAL PARTNERS 5+++++++++++-+

## 2018-07-05 ENCOUNTER — Ambulatory Visit (INDEPENDENT_AMBULATORY_CARE_PROVIDER_SITE_OTHER): Payer: Medicare Other | Admitting: Women's Health

## 2018-07-05 ENCOUNTER — Encounter: Payer: Self-pay | Admitting: Women's Health

## 2018-07-05 VITALS — BP 124/80 | Ht 62.0 in | Wt 123.0 lb

## 2018-07-05 DIAGNOSIS — Z9189 Other specified personal risk factors, not elsewhere classified: Secondary | ICD-10-CM | POA: Diagnosis not present

## 2018-07-05 DIAGNOSIS — Z01419 Encounter for gynecological examination (general) (routine) without abnormal findings: Secondary | ICD-10-CM

## 2018-07-05 DIAGNOSIS — Z79890 Hormone replacement therapy (postmenopausal): Secondary | ICD-10-CM

## 2018-07-05 DIAGNOSIS — B009 Herpesviral infection, unspecified: Secondary | ICD-10-CM

## 2018-07-05 MED ORDER — MEDROXYPROGESTERONE ACETATE 10 MG PO TABS: ORAL_TABLET | ORAL | 4 refills | Status: DC

## 2018-07-05 MED ORDER — ESTRADIOL 0.5 MG PO TABS: 0.5000 mg | ORAL_TABLET | Freq: Every day | ORAL | 4 refills | Status: DC

## 2018-07-05 MED ORDER — ACYCLOVIR 200 MG PO CAPS: ORAL_CAPSULE | ORAL | 4 refills | Status: DC

## 2018-07-05 NOTE — Patient Instructions (Signed)
Health Maintenance for Postmenopausal Women Menopause is a normal process in which your reproductive ability comes to an end. This process happens gradually over a span of months to years, usually between the ages of 48 and 31. Menopause is complete when you have missed 12 consecutive menstrual periods. It is important to talk with your health care provider about some of the most common conditions that affect postmenopausal women, such as heart disease, cancer, and bone loss (osteoporosis). Adopting a healthy lifestyle and getting preventive care can help to promote your health and wellness. Those actions can also lower your chances of developing some of these common conditions. What should I know about menopause? During menopause, you may experience a number of symptoms, such as:  Moderate-to-severe hot flashes.  Night sweats.  Decrease in sex drive.  Mood swings.  Headaches.  Tiredness.  Irritability.  Memory problems.  Insomnia.  Choosing to treat or not to treat menopausal changes is an individual decision that you make with your health care provider. What should I know about hormone replacement therapy and supplements? Hormone therapy products are effective for treating symptoms that are associated with menopause, such as hot flashes and night sweats. Hormone replacement carries certain risks, especially as you become older. If you are thinking about using estrogen or estrogen with progestin treatments, discuss the benefits and risks with your health care provider. What should I know about heart disease and stroke? Heart disease, heart attack, and stroke become more likely as you age. This may be due, in part, to the hormonal changes that your body experiences during menopause. These can affect how your body processes dietary fats, triglycerides, and cholesterol. Heart attack and stroke are both medical emergencies. There are many things that you can do to help prevent heart disease  and stroke:  Have your blood pressure checked at least every 1-2 years. High blood pressure causes heart disease and increases the risk of stroke.  If you are 77-4 years old, ask your health care provider if you should take aspirin to prevent a heart attack or a stroke.  Do not use any tobacco products, including cigarettes, chewing tobacco, or electronic cigarettes. If you need help quitting, ask your health care provider.  It is important to eat a healthy diet and maintain a healthy weight. ? Be sure to include plenty of vegetables, fruits, low-fat dairy products, and lean protein. ? Avoid eating foods that are high in solid fats, added sugars, or salt (sodium).  Get regular exercise. This is one of the most important things that you can do for your health. ? Try to exercise for at least 150 minutes each week. The type of exercise that you do should increase your heart rate and make you sweat. This is known as moderate-intensity exercise. ? Try to do strengthening exercises at least twice each week. Do these in addition to the moderate-intensity exercise.  Know your numbers.Ask your health care provider to check your cholesterol and your blood glucose. Continue to have your blood tested as directed by your health care provider.  What should I know about cancer screening? There are several types of cancer. Take the following steps to reduce your risk and to catch any cancer development as early as possible. Breast Cancer  Practice breast self-awareness. ? This means understanding how your breasts normally appear and feel. ? It also means doing regular breast self-exams. Let your health care provider know about any changes, no matter how small.  If you are 40  or older, have a clinician do a breast exam (clinical breast exam or CBE) every year. Depending on your age, family history, and medical history, it may be recommended that you also have a yearly breast X-ray (mammogram).  If you  have a family history of breast cancer, talk with your health care provider about genetic screening.  If you are at high risk for breast cancer, talk with your health care provider about having an MRI and a mammogram every year.  Breast cancer (BRCA) gene test is recommended for women who have family members with BRCA-related cancers. Results of the assessment will determine the need for genetic counseling and BRCA1 and for BRCA2 testing. BRCA-related cancers include these types: ? Breast. This occurs in males or females. ? Ovarian. ? Tubal. This may also be called fallopian tube cancer. ? Cancer of the abdominal or pelvic lining (peritoneal cancer). ? Prostate. ? Pancreatic.  Cervical, Uterine, and Ovarian Cancer Your health care provider may recommend that you be screened regularly for cancer of the pelvic organs. These include your ovaries, uterus, and vagina. This screening involves a pelvic exam, which includes checking for microscopic changes to the surface of your cervix (Pap test).  For women ages 21-65, health care providers may recommend a pelvic exam and a Pap test every three years. For women ages 79-65, they may recommend the Pap test and pelvic exam, combined with testing for human papilloma virus (HPV), every five years. Some types of HPV increase your risk of cervical cancer. Testing for HPV may also be done on women of any age who have unclear Pap test results.  Other health care providers may not recommend any screening for nonpregnant women who are considered low risk for pelvic cancer and have no symptoms. Ask your health care provider if a screening pelvic exam is right for you.  If you have had past treatment for cervical cancer or a condition that could lead to cancer, you need Pap tests and screening for cancer for at least 20 years after your treatment. If Pap tests have been discontinued for you, your risk factors (such as having a new sexual partner) need to be  reassessed to determine if you should start having screenings again. Some women have medical problems that increase the chance of getting cervical cancer. In these cases, your health care provider may recommend that you have screening and Pap tests more often.  If you have a family history of uterine cancer or ovarian cancer, talk with your health care provider about genetic screening.  If you have vaginal bleeding after reaching menopause, tell your health care provider.  There are currently no reliable tests available to screen for ovarian cancer.  Lung Cancer Lung cancer screening is recommended for adults 69-62 years old who are at high risk for lung cancer because of a history of smoking. A yearly low-dose CT scan of the lungs is recommended if you:  Currently smoke.  Have a history of at least 30 pack-years of smoking and you currently smoke or have quit within the past 15 years. A pack-year is smoking an average of one pack of cigarettes per day for one year.  Yearly screening should:  Continue until it has been 15 years since you quit.  Stop if you develop a health problem that would prevent you from having lung cancer treatment.  Colorectal Cancer  This type of cancer can be detected and can often be prevented.  Routine colorectal cancer screening usually begins at  age 42 and continues through age 45.  If you have risk factors for colon cancer, your health care provider may recommend that you be screened at an earlier age.  If you have a family history of colorectal cancer, talk with your health care provider about genetic screening.  Your health care provider may also recommend using home test kits to check for hidden blood in your stool.  A small camera at the end of a tube can be used to examine your colon directly (sigmoidoscopy or colonoscopy). This is done to check for the earliest forms of colorectal cancer.  Direct examination of the colon should be repeated every  5-10 years until age 71. However, if early forms of precancerous polyps or small growths are found or if you have a family history or genetic risk for colorectal cancer, you may need to be screened more often.  Skin Cancer  Check your skin from head to toe regularly.  Monitor any moles. Be sure to tell your health care provider: ? About any new moles or changes in moles, especially if there is a change in a mole's shape or color. ? If you have a mole that is larger than the size of a pencil eraser.  If any of your family members has a history of skin cancer, especially at a young age, talk with your health care provider about genetic screening.  Always use sunscreen. Apply sunscreen liberally and repeatedly throughout the day.  Whenever you are outside, protect yourself by wearing long sleeves, pants, a wide-brimmed hat, and sunglasses.  What should I know about osteoporosis? Osteoporosis is a condition in which bone destruction happens more quickly than new bone creation. After menopause, you may be at an increased risk for osteoporosis. To help prevent osteoporosis or the bone fractures that can happen because of osteoporosis, the following is recommended:  If you are 46-71 years old, get at least 1,000 mg of calcium and at least 600 mg of vitamin D per day.  If you are older than age 55 but younger than age 65, get at least 1,200 mg of calcium and at least 600 mg of vitamin D per day.  If you are older than age 54, get at least 1,200 mg of calcium and at least 800 mg of vitamin D per day.  Smoking and excessive alcohol intake increase the risk of osteoporosis. Eat foods that are rich in calcium and vitamin D, and do weight-bearing exercises several times each week as directed by your health care provider. What should I know about how menopause affects my mental health? Depression may occur at any age, but it is more common as you become older. Common symptoms of depression  include:  Low or sad mood.  Changes in sleep patterns.  Changes in appetite or eating patterns.  Feeling an overall lack of motivation or enjoyment of activities that you previously enjoyed.  Frequent crying spells.  Talk with your health care provider if you think that you are experiencing depression. What should I know about immunizations? It is important that you get and maintain your immunizations. These include:  Tetanus, diphtheria, and pertussis (Tdap) booster vaccine.  Influenza every year before the flu season begins.  Pneumonia vaccine.  Shingles vaccine.  Your health care provider may also recommend other immunizations. This information is not intended to replace advice given to you by your health care provider. Make sure you discuss any questions you have with your health care provider. Document Released: 01/21/2006  Document Revised: 06/18/2016 Document Reviewed: 09/02/2015 Elsevier Interactive Patient Education  [employer].

## 2018-07-05 NOTE — Progress Notes (Signed)
Rachel ArchLinda Shepherd 02/07/1951 [id number]    History:    Presents for breast and pelvic exam.  Postmenopausal  on HRT with no bleeding currently taking 0.25 mg of Estrace with only occasional hot flash.  Normal Pap and mammogram history.  2016 DEXA T score +1.1 at spine.  2018 benign colon polyp has had follow-up is on a 3-year recall.  Vaccines current.    Past medical history, past surgical history, family history and social history were all reviewed and documented in the EPIC chart.  Works part-time with [employer].  Has 3 stepchildren. Husband scheduled for liver biopsy this week.   ROS:  A ROS was performed and pertinent positives and negatives are included.  Exam:  Vitals:   07/05/18 1427  BP: 124/80  Weight: 123 lb (55.8 kg)  Height: 5\' 2"  (1.575 m)   Body mass index is 22.5 kg/m.   General appearance:  Normal Thyroid:  Symmetrical, normal in size, without palpable masses or nodularity. Respiratory  Auscultation:  Clear without wheezing or rhonchi Cardiovascular  Auscultation:  Regular rate, without rubs, murmurs or gallops  Edema/varicosities:  Not grossly evident Abdominal  Soft,nontender, without masses, guarding or rebound.  Liver/spleen:  No organomegaly noted  Hernia:  None appreciated  Skin  Inspection:  Grossly normal   Breasts: Examined lying and sitting.     Right: Without masses, retractions, discharge or axillary adenopathy.     Left: Without masses, retractions, discharge or axillary adenopathy. Gentitourinary   Inguinal/mons:  Normal without inguinal adenopathy  External genitalia:  Normal  BUS/Urethra/Skene's glands:  Normal  Vagina:  Normal  Cervix:  Normal  Uterus:  normal in size, shape and contour.  Midline and mobile  Adnexa/parametria:     Rt: Without masses or tenderness.   Lt: Without masses or tenderness.  Anus and perineum: Normal  Digital rectal exam: Normal sphincter tone without palpated masses or tenderness  Assessment/Plan:  67  y.o. MWF G0 +3 stepchildren for breast and pelvic exam with no complaints.  Postmenopausal on HRT with no bleeding HSV rare outbreaks suppressive therapy  Plan: Acyclovir 200 mg twice daily prescription, proper use given and reviewed may try taking only 1 tablet daily, can take up to 5 as needed if outbreaks.  Estrace 0.5 tablet,  half daily, Provera 10 mg day 1 through 12 of each month, is going to try to decrease dose daily to stop.  Reviewed risks of blood clots, strokes and breast cancer best to you shortest amount of time possible.  SBE's, continue annual screening mammogram, calcium rich foods, vitamin D 2000 daily encouraged.  Home safety, fall prevention and importance of continuing healthy lifestyle of regular exercise and healthy diet.  Pap normal 2018, new screening guidelines reviewed.   Harrington Challengerancy J Young [facility]ingWHNP, 4:57 PM 07/05/2018

## 2018-08-01 ENCOUNTER — Ambulatory Visit (INDEPENDENT_AMBULATORY_CARE_PROVIDER_SITE_OTHER): Payer: Medicare Other | Admitting: Orthopaedic Surgery

## 2018-08-02 ENCOUNTER — Ambulatory Visit (INDEPENDENT_AMBULATORY_CARE_PROVIDER_SITE_OTHER): Payer: Medicare Other | Admitting: Orthopaedic Surgery

## 2019-02-01 ENCOUNTER — Ambulatory Visit: Payer: Medicare Other | Admitting: Psychiatry

## 2019-05-10 ENCOUNTER — Encounter: Payer: Self-pay | Admitting: Women's Health

## 2019-05-18 ENCOUNTER — Encounter: Payer: Self-pay | Admitting: Orthopaedic Surgery

## 2019-05-18 ENCOUNTER — Ambulatory Visit: Payer: Self-pay

## 2019-05-18 ENCOUNTER — Ambulatory Visit (INDEPENDENT_AMBULATORY_CARE_PROVIDER_SITE_OTHER): Payer: Medicare Other

## 2019-05-18 ENCOUNTER — Ambulatory Visit (INDEPENDENT_AMBULATORY_CARE_PROVIDER_SITE_OTHER): Payer: Medicare Other | Admitting: Orthopaedic Surgery

## 2019-05-18 ENCOUNTER — Other Ambulatory Visit: Payer: Self-pay

## 2019-05-18 DIAGNOSIS — M79672 Pain in left foot: Secondary | ICD-10-CM

## 2019-05-18 DIAGNOSIS — M79671 Pain in right foot: Secondary | ICD-10-CM | POA: Diagnosis not present

## 2019-05-18 NOTE — Progress Notes (Signed)
Office Visit Note   Patient: Rachel Shepherd           Date of Birth: [birth year]           MRN: [id number] Visit Date: 05/18/2019              Requested by: Eartha Inch, MD [address] [place], [place] [address] PCP: Eartha Inch, MD   Assessment & Plan: Visit Diagnoses:  1. Bilateral foot pain     Plan: Impression is left greater than right hallux rigidus with mild bunion deformity.  X-rays are negative for acute or structural abnormalities.  I have recommended activity modifications, over-the-counter Voltaren gel, ibuprofen.  Proper shoe wear was also discussed.  Low suspicion for gout or infection.  Questions encouraged and answered.  Follow-up as needed.  Follow-Up Instructions: Return if symptoms worsen or fail to improve.   Orders:  Orders Placed This Encounter  Procedures  . XR Foot Complete Left  . XR Foot Complete Right   No orders of the defined types were placed in this encounter.     Procedures: No procedures performed   Clinical Data: No additional findings.   Subjective: Chief Complaint  Patient presents with  . Left Foot - Pain, Edema  . Right Foot - Pain, Edema    Rachel Shepherd is a very pleasant 68 year old female who is well-known to me and who is somebody that I have been taking care of for the last several years who comes in with bilateral great toe pain that is worse on the left that started approximately 3 weeks ago.  She denies any injuries.  She has been and is a very active person and she feels pain in her great toe MTP joints.  The pain is worse with walking and flexion of the MTP joint joint.  She denies a history of gout.  Denies any constitutional symptoms.  She feels an occasional tingling.  She does endorse some swelling.  Rigid soled shoes help with the pain.   Review of Systems  Constitutional: Negative.   HENT: Negative.   Eyes: Negative.   Respiratory: Negative.   Cardiovascular: Negative.   Endocrine: Negative.    Musculoskeletal: Negative.   Neurological: Negative.   Hematological: Negative.   Psychiatric/Behavioral: Negative.   All other systems reviewed and are negative.    Objective: Vital Signs: There were no vitals taken for this visit.  Physical Exam Vitals signs and nursing note reviewed.  Constitutional:      Appearance: She is well-developed.  Pulmonary:     Effort: Pulmonary effort is normal.  Skin:    General: Skin is warm.     Capillary Refill: Capillary refill takes less than 2 seconds.  Neurological:     Mental Status: She is alert and oriented to person, place, and time.  Psychiatric:        Behavior: Behavior normal.        Thought Content: Thought content normal.        Judgment: Judgment normal.     Ortho Exam Bilateral feet exam show localized mild swelling worse on the left foot around the MTP joint.  Clinically she has very mild bunion deformity.  There is no increased warmth or cellulitis.  There is just some light erythema.  No evidence of infection.  No neurovascular compromise.  Positive grind test of the left big toe.  Full range of motion of the MTP joints. Specialty Comments:  No specialty comments available.  Imaging:  Xr Foot Complete Left  Result Date: 05/18/2019 No acute or structural abnormalities.  Xr Foot Complete Right  Result Date: 05/18/2019 No acute or structural abnormalities.    PMFS History: Patient Active Problem List   Diagnosis Date Noted  . Acute pain of right shoulder 10/13/2017  . Acute pain of left shoulder 10/13/2017  . Acute pain of right knee 04/29/2017  . Impingement syndrome of right shoulder 04/29/2017  . Hypertension   . SUI (stress urinary incontinence, female)   . Atrophic vaginitis   . HSV-2 (herpes simplex virus 2) infection   . Dysplasia of cervix, low grade (CIN 1)   . High risk HPV infection   . PAIN IN JOINT, SITE UNSPECIFIED 12/02/2010  . SLEEP DISORDER 12/02/2010  . MUSCLE PAIN 12/09/2009  .  POSTMENOPAUSAL STATUS 11/21/2009  . CHEST DISCOMFORT 09/29/2009  . COLONIC POLYPS, HX OF 07/09/2009  . CHRONIC GRANULOMATOUS DISEASE 04/16/2008   Past Medical History:  Diagnosis Date  . Atrophic vaginitis   . Dysplasia of cervix, 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   HPV-HIGH RISK  . 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  . HSV-2 (herpes simplex virus 2) infection   . Hypertension   . SUI (stress urinary incontinence, female)     Family History  Problem Relation Age of Onset  . Heart disease Mother        PACE MAKER  . Breast cancer Mother        Age 70  . Stroke Paternal Grandmother   . Heart disease Paternal Grandmother     Past Surgical History:  Procedure Laterality Date  . COLPOSCOPY    . CYST REMOVED RIGHT CHEEK    . 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 TUBAL LIGATION    . WRIST SURGERY     Social History   Occupational History  . Not on file  Tobacco Use  . Smoking status: Never Smoker  . Smokeless tobacco: Never Used  Substance and Sexual Activity  . Alcohol use: Yes    Alcohol/week: 14.0 standard drinks    Types: 14 Standard drinks or equivalent per week  . Drug use: No  . Sexual activity: Never    Birth control/protection: Post-menopausal    Comment: INTERCOURSE AGE 67, SEXUAL PARTNERS 5+++++++++++-+

## 2019-07-11 ENCOUNTER — Encounter: Payer: Medicare Other | Admitting: Women's Health

## 2019-07-16 ENCOUNTER — Other Ambulatory Visit: Payer: Self-pay

## 2019-07-17 ENCOUNTER — Ambulatory Visit (INDEPENDENT_AMBULATORY_CARE_PROVIDER_SITE_OTHER): Payer: Medicare Other | Admitting: Women's Health

## 2019-07-17 ENCOUNTER — Encounter: Payer: Self-pay | Admitting: Women's Health

## 2019-07-17 DIAGNOSIS — B009 Herpesviral infection, unspecified: Secondary | ICD-10-CM | POA: Diagnosis not present

## 2019-07-17 DIAGNOSIS — Z9189 Other specified personal risk factors, not elsewhere classified: Secondary | ICD-10-CM

## 2019-07-17 DIAGNOSIS — Z01419 Encounter for gynecological examination (general) (routine) without abnormal findings: Secondary | ICD-10-CM

## 2019-07-17 MED ORDER — ACYCLOVIR 200 MG PO CAPS: ORAL_CAPSULE | ORAL | 4 refills | Status: DC

## 2019-07-17 NOTE — Progress Notes (Signed)
Rachel Shepherd 1951/04/21 [id number]    History:    Presents for breast and pelvic exam.  Postmenopausal with no bleeding.  Has been on 0.25 estradiol and Provera first 12 days of each month and has been decreasing dose/frequency and would like to stop HRT.  Normal Pap and mammogram history.  Primary care manages hypertension.  HSV is been on daily suppression with no outbreaks.  2016 normal DEXA primary care 5-year follow-up.  2018 benign colon polyp 3-year follow-up.  Husband died of liver disease 2019/03/18.  Current on vaccines.  Past medical history, past surgical history, family history and social history were all reviewed and documented in the EPIC chart.  3 stepchildren.  States has good friends support.  Exercises on a regular basis.  ROS:  A ROS was performed and pertinent positives and negatives are included.  Exam:  Vitals:   07/17/19 1428  BP: 120/82  Weight: 103 lb (46.7 kg)  Height: 5\' 2"  (1.575 m)   Body mass index is 18.84 kg/m.   General appearance:  Normal Thyroid:  Symmetrical, normal in size, without palpable masses or nodularity. Respiratory  Auscultation:  Clear without wheezing or rhonchi Cardiovascular  Auscultation:  Regular rate, without rubs, murmurs or gallops  Edema/varicosities:  Not grossly evident Abdominal  Soft,nontender, without masses, guarding or rebound.  Liver/spleen:  No organomegaly noted  Hernia:  None appreciated  Skin  Inspection:  Grossly normal   Breasts: Examined lying and sitting.     Right: Without masses, retractions, discharge or axillary adenopathy.     Left: Without masses, retractions, discharge or axillary adenopathy. Gentitourinary   Inguinal/mons:  Normal without inguinal adenopathy  External genitalia:  Normal  BUS/Urethra/Skene's glands:  Normal  Vagina:  Normal  Cervix:  Normal  Uterus:  normal in size, shape and contour.  Midline and mobile  Adnexa/parametria:     Rt: Without masses or tenderness.   Lt: Without  masses or tenderness.  Anus and perineum: Normal  Digital rectal exam: Normal sphincter tone without palpated masses or tenderness  Assessment/Plan: 68 y.o. W WF G0 for breast and pelvic exam with no GYN complaints.  Postmenopausal weaning off HRT and would like to stop. Hypertension-primary care manages labs and meds HSV rare outbreaks Situational stress recent death of husband  Plan: Will continue to decrease estradiol and Provera and then stop no refills of medication given.  SBEs, continue annual screening mammogram, calcium rich foods, vitamin D 2000 daily encouraged.  Reviewed importance of continuing regular exercise, home safety, fall prevention discussed.  Acyclovir 200 mg has been taking 2 tablets daily will decrease to 1 tablet daily and then use episodic as needed.  Condolences given, continue self-care, leisure activities counseling as needed.      Huel Cote [facility], 5:31 PM 07/17/2019

## 2019-07-17 NOTE — Patient Instructions (Signed)
Health Maintenance After Age 70 After age 70, you are at a higher risk for certain long-term diseases and infections as well as injuries from falls. Falls are a major cause of broken bones and head injuries in people who are older than age 70. Getting regular preventive care can help to keep you healthy and well. Preventive care includes getting regular testing and making lifestyle changes as recommended by your health care provider. Talk with your health care provider about:  Which screenings and tests you should have. A screening is a test that checks for a disease when you have no symptoms.  A diet and exercise plan that is right for you. What should I know about screenings and tests to prevent falls? Screening and testing are the best ways to find a health problem early. Early diagnosis and treatment give you the best chance of managing medical conditions that are common after age 70. Certain conditions and lifestyle choices may make you more likely to have a fall. Your health care provider may recommend:  Regular vision checks. Poor vision and conditions such as cataracts can make you more likely to have a fall. If you wear glasses, make sure to get your prescription updated if your vision changes.  Medicine review. Work with your health care provider to regularly review all of the medicines you are taking, including over-the-counter medicines. Ask your health care provider about any side effects that may make you more likely to have a fall. Tell your health care provider if any medicines that you take make you feel dizzy or sleepy.  Osteoporosis screening. Osteoporosis is a condition that causes the bones to get weaker. This can make the bones weak and cause them to break more easily.  Blood pressure screening. Blood pressure changes and medicines to control blood pressure can make you feel dizzy.  Strength and balance checks. Your health care provider may recommend certain tests to check your  strength and balance while standing, walking, or changing positions.  Foot health exam. Foot pain and numbness, as well as not wearing proper footwear, can make you more likely to have a fall.  Depression screening. You may be more likely to have a fall if you have a fear of falling, feel emotionally low, or feel unable to do activities that you used to do.  Alcohol use screening. Using too much alcohol can affect your balance and may make you more likely to have a fall. What actions can I take to lower my risk of falls? General instructions  Talk with your health care provider about your risks for falling. Tell your health care provider if: ? You fall. Be sure to tell your health care provider about all falls, even ones that seem minor. ? You feel dizzy, sleepy, or off-balance.  Take over-the-counter and prescription medicines only as told by your health care provider. These include any supplements.  Eat a healthy diet and maintain a healthy weight. A healthy diet includes low-fat dairy products, low-fat (lean) meats, and fiber from whole grains, beans, and lots of fruits and vegetables. Home safety  Remove any tripping hazards, such as rugs, cords, and clutter.  Install safety equipment such as grab bars in bathrooms and safety rails on stairs.  Keep rooms and walkways well-lit. Activity   Follow a regular exercise program to stay fit. This will help you maintain your balance. Ask your health care provider what types of exercise are appropriate for you.  If you need a cane or   walker, use it as recommended by your health care provider.  Wear supportive shoes that have nonskid soles. Lifestyle  Do not drink alcohol if your health care provider tells you not to drink.  If you drink alcohol, limit how much you have: ? 0-1 drink a day for women. ? 0-2 drinks a day for men.  Be aware of how much alcohol is in your drink. In the [place] one drink equals one typical bottle of beer (12  oz), one-half glass of wine (5 oz), or one shot of hard liquor (1 oz).  Do not use any products that contain nicotine or tobacco, such as cigarettes and e-cigarettes. If you need help quitting, ask your health care provider. Summary  Having a healthy lifestyle and getting preventive care can help to protect your health and wellness after age 70.  Screening and testing are the best way to find a health problem early and help you avoid having a fall. Early diagnosis and treatment give you the best chance for managing medical conditions that are more common for people who are older than age 70.  Falls are a major cause of broken bones and head injuries in people who are older than age 70. Take precautions to prevent a fall at home.  Work with your health care provider to learn what changes you can make to improve your health and wellness and to prevent falls. This information is not intended to replace advice given to you by your health care provider. Make sure you discuss any questions you have with your health care provider. Document Released: 10/12/2017 Document Revised: 03/22/2019 Document Reviewed: 10/12/2017 Elsevier Patient Education  2020 Elsevier Inc.  

## 2019-09-05 ENCOUNTER — Ambulatory Visit (INDEPENDENT_AMBULATORY_CARE_PROVIDER_SITE_OTHER): Payer: Medicare Other

## 2019-09-05 ENCOUNTER — Ambulatory Visit (INDEPENDENT_AMBULATORY_CARE_PROVIDER_SITE_OTHER): Payer: Medicare Other | Admitting: Orthopaedic Surgery

## 2019-09-05 ENCOUNTER — Encounter: Payer: Self-pay | Admitting: Orthopaedic Surgery

## 2019-09-05 VITALS — Ht 62.0 in | Wt 103.0 lb

## 2019-09-05 DIAGNOSIS — M25562 Pain in left knee: Secondary | ICD-10-CM

## 2019-09-05 DIAGNOSIS — M25511 Pain in right shoulder: Secondary | ICD-10-CM

## 2019-09-05 DIAGNOSIS — J3489 Other specified disorders of nose and nasal sinuses: Secondary | ICD-10-CM

## 2019-09-05 NOTE — Progress Notes (Signed)
Office Visit Note   Patient: Rachel Shepherd           Date of Birth: 1951-10-11           MRN: [id number] Visit Date: 09/05/2019              Requested by: Rachel Inch, MD [address] [place],  [place] [address] PCP: Rachel Inch, MD   Assessment & Plan: Visit Diagnoses:  1. Acute pain of left knee   2. Acute pain of right shoulder   3. Nose pain     Plan: Impression is contusion to the left knee, nose and right shoulder biceps tendinitis.  I recommend symptomatic treatment with ice, heat, NSAIDs as needed.  Activity as tolerated.  Questions encouraged and answered.  Follow-up as needed.  Follow-Up Instructions: Return if symptoms worsen or fail to improve.   Orders:  Orders Placed This Encounter  Procedures  . XR Nasal Bones  . XR Shoulder Right  . XR KNEE 3 VIEW LEFT   No orders of the defined types were placed in this encounter.     Procedures: No procedures performed   Clinical Data: No additional findings.   Subjective: Chief Complaint  Patient presents with  . Left Knee - Pain  . Right Shoulder - Pain    Rachel Shepherd is a 68 year old female who is very well-known to me who comes in for evaluation of nose pain, right shoulder pain, left knee pain.  [place] she [place] up the stairs and had acute onset of the above-mentioned symptoms.  She states that she does not have any problems with weightbearing or walking.  Her right shoulder hurts worse with reaching back and playing pickle ball.  Denies any numbness and tingling.  Denies any trouble breathing.   Review of Systems  Constitutional: Negative.   HENT: Negative.   Eyes: Negative.   Respiratory: Negative.   Cardiovascular: Negative.   Endocrine: Negative.   Musculoskeletal: Negative.   Neurological: Negative.   Hematological: Negative.   Psychiatric/Behavioral: Negative.   All other systems reviewed and are negative.    Objective: Vital Signs: Ht 5\' 2"  (1.575 m)    Wt 103 lb (46.7 kg)   BMI 18.84 kg/m   Physical Exam Vitals signs and nursing note reviewed.  Constitutional:      Appearance: She is well-developed.  Pulmonary:     Effort: Pulmonary effort is normal.  Skin:    General: Skin is warm.     Capillary Refill: Capillary refill takes less than 2 seconds.  Neurological:     Mental Status: She is alert and oriented to person, place, and time.  Psychiatric:        Behavior: Behavior normal.        Thought Content: Thought content normal.        Judgment: Judgment normal.     Ortho Exam Exam of the nose shows no clinical malalignments.  She does not have any trouble breathing.  It is slightly tender to palpation. Right shoulder exam shows normal active and passive range of motion.  Rotator cuff was normal to manual muscle testing.  Negative impingement signs. Left knee exam shows full range of motion.  Mild swelling of the prepatellar bursa.  Small abrasion to the anterior aspect of the left knee.  Pleasant cruciates are stable. Specialty Comments:  No specialty comments available.  Imaging: Xr Nasal Bones  Result Date: 09/05/2019 negative  Xr Knee  3 View Left  Result Date: 09/05/2019 No acute or structural abnormalities  Xr Shoulder Right  Result Date: 09/05/2019 No acute or structural abnormalities    PMFS History: Patient Active Problem List   Diagnosis Date Noted  . Acute pain of right shoulder 10/13/2017  . Acute pain of left shoulder 10/13/2017  . Acute pain of right knee 04/29/2017  . Impingement syndrome of right shoulder 04/29/2017  . Hypertension   . SUI (stress urinary incontinence, female)   . Atrophic vaginitis   . HSV-2 (herpes simplex virus 2) infection   . Dysplasia of cervix, low grade (CIN 1)   . High risk HPV infection   . PAIN IN JOINT, SITE UNSPECIFIED 12/02/2010  . SLEEP DISORDER 12/02/2010  . MUSCLE PAIN 12/09/2009  . POSTMENOPAUSAL STATUS 11/21/2009  . CHEST DISCOMFORT 09/29/2009  . COLONIC  POLYPS, HX OF 07/09/2009  . CHRONIC GRANULOMATOUS DISEASE 04/16/2008   Past Medical History:  Diagnosis Date  . Atrophic vaginitis   . Dysplasia of cervix, 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   HPV-HIGH RISK  . 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  . HSV-2 (herpes simplex virus 2) infection   . Hypertension   . SUI (stress urinary incontinence, female)     Family History  Problem Relation Age of Onset  . Heart disease Mother        PACE MAKER  . Breast cancer Mother        Age 24  . Stroke Paternal Grandmother   . Heart disease Paternal Grandmother     Past Surgical History:  Procedure Laterality Date  . COLPOSCOPY    . CYST REMOVED RIGHT CHEEK    . 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 TUBAL LIGATION    . WRIST SURGERY     Social History   Occupational History  . Not on file  Tobacco Use  . Smoking status: Never Smoker  . Smokeless tobacco: Never Used  Substance and Sexual Activity  . Alcohol use: Yes    Alcohol/week: 14.0 standard drinks    Types: 14 Standard drinks or equivalent per week  . Drug use: No  . Sexual activity: Not Currently    Birth control/protection: Post-menopausal    Comment: INTERCOURSE AGE 20, SEXUAL PARTNERS 5+++++++++++-+

## 2019-09-10 ENCOUNTER — Encounter: Payer: Self-pay | Admitting: Women's Health

## 2019-09-12 ENCOUNTER — Encounter: Payer: Self-pay | Admitting: Women's Health

## 2019-09-12 ENCOUNTER — Other Ambulatory Visit: Payer: Self-pay | Admitting: Women's Health

## 2019-09-12 DIAGNOSIS — Z79890 Hormone replacement therapy (postmenopausal): Secondary | ICD-10-CM

## 2019-09-12 MED ORDER — MEDROXYPROGESTERONE ACETATE 10 MG PO TABS: ORAL_TABLET | ORAL | 4 refills | Status: DC

## 2019-09-12 NOTE — Telephone Encounter (Signed)
Rachel Shepherd put asked for recall in 07/2020

## 2019-09-13 ENCOUNTER — Encounter: Payer: Self-pay | Admitting: Orthopaedic Surgery

## 2019-09-17 ENCOUNTER — Other Ambulatory Visit: Payer: Self-pay

## 2019-09-17 ENCOUNTER — Telehealth: Payer: Self-pay | Admitting: Orthopaedic Surgery

## 2019-09-17 ENCOUNTER — Encounter: Payer: Self-pay | Admitting: Orthopaedic Surgery

## 2019-09-17 DIAGNOSIS — G8929 Other chronic pain: Secondary | ICD-10-CM

## 2019-09-17 DIAGNOSIS — M25511 Pain in right shoulder: Secondary | ICD-10-CM

## 2019-09-17 NOTE — Telephone Encounter (Signed)
See message below... I put order in today.

## 2019-09-17 NOTE — Telephone Encounter (Signed)
Patient called. Would like a MRI done this week. Her call back number is [phone]

## 2019-09-18 NOTE — Telephone Encounter (Signed)
Ok to call in tramadol 50mg  1 tab tid prn pain #30, no refills

## 2019-09-18 NOTE — Telephone Encounter (Signed)
Order emailed to [facility], they will contact pt to schedule appt

## 2019-09-19 ENCOUNTER — Other Ambulatory Visit: Payer: Self-pay

## 2019-09-19 MED ORDER — TRAMADOL HCL 50 MG PO TABS: ORAL_TABLET | ORAL | 0 refills | Status: DC

## 2019-09-24 ENCOUNTER — Telehealth: Payer: Self-pay | Admitting: Orthopaedic Surgery

## 2019-09-24 NOTE — Telephone Encounter (Signed)
Patient returned a phone call.  

## 2019-09-24 NOTE — Telephone Encounter (Signed)
Please advise and do you want patient worked in the schedule for injection with another provider today.  Thanks

## 2019-09-24 NOTE — Telephone Encounter (Signed)
Patient called wanting to know if Dr. Erlinda Hong has seen her MRI and if she has a rotator cuff tear could Dr. Erlinda Hong give her a cortisone injection before she goes out of town tomorrow.  She is in a lot of pain.  CB#[phone].  Thank you.

## 2019-09-24 NOTE — Telephone Encounter (Signed)
Left voice mail

## 2019-10-03 ENCOUNTER — Encounter: Payer: Self-pay | Admitting: Women's Health

## 2019-10-04 ENCOUNTER — Other Ambulatory Visit: Payer: Self-pay

## 2019-10-04 ENCOUNTER — Telehealth: Payer: Self-pay | Admitting: Orthopaedic Surgery

## 2019-10-04 DIAGNOSIS — Z20822 Contact with and (suspected) exposure to covid-19: Secondary | ICD-10-CM

## 2019-10-04 NOTE — Telephone Encounter (Signed)
See message. Okay to wait till appt for need to see her sooner?

## 2019-10-04 NOTE — Telephone Encounter (Signed)
I don't see where she has had the MRI yet.  Need to wait until she has this.  Also, looks like an order was placed today for a covid test as she appears to be symptomatic according to order

## 2019-10-04 NOTE — Telephone Encounter (Signed)
I called pt to reschedule appt for 10/27 due to the move. I did move pt's appt to 11/3 but pt wanted me to send a message to dr.xu to see if he can get her in sooner due to her having a rotator cuff tear.  Please give pt a call [phone]

## 2019-10-06 LAB — NOVEL CORONAVIRUS, NAA: SARS-CoV-2, NAA: NOT DETECTED

## 2019-10-09 ENCOUNTER — Encounter: Payer: Self-pay | Admitting: Orthopaedic Surgery

## 2019-10-09 ENCOUNTER — Ambulatory Visit: Payer: Medicare Other | Admitting: Orthopaedic Surgery

## 2019-10-09 NOTE — Telephone Encounter (Signed)
Please send in PT referral to [facility] for rotator cuff syndrome.  Thanks.

## 2019-10-10 NOTE — Telephone Encounter (Signed)
She will pick up PT script. See other messages.

## 2019-10-15 ENCOUNTER — Encounter: Payer: Self-pay | Admitting: Women's Health

## 2019-10-15 ENCOUNTER — Other Ambulatory Visit: Payer: Self-pay

## 2019-10-15 ENCOUNTER — Ambulatory Visit (INDEPENDENT_AMBULATORY_CARE_PROVIDER_SITE_OTHER): Payer: Medicare Other | Admitting: Women's Health

## 2019-10-15 VITALS — BP 130/80

## 2019-10-15 DIAGNOSIS — Z79890 Hormone replacement therapy (postmenopausal): Secondary | ICD-10-CM | POA: Diagnosis not present

## 2019-10-15 NOTE — Progress Notes (Signed)
68 year old W WF G0 presents to discuss HRT, had annual exam in August but is unsure of how to go off of the HRT.  Currently on 1/2 tablet of the 0.5 estradiol and Provera daily no bleeding with continued hot flushes, and not severe.  States when has tried to come off in the past has had numerous hot flushes and did not feel as well.  Husband died 03-23-19 after a long illness.  Denies any urinary, vaginal symptoms, abdominal/back pain or fever.  Primary care manages hypertension, HSV daily suppression no outbreaks.  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  Also reports is worried about her mother who lives in [place] has dementia stepfather caring for her around-the-clock.  Exam: Appears well.  HRT  Plan: HRT risks of blood clots, strokes and breast cancer reviewed, reviewed minimal risks with low dose estradiol 0.25 and Provera.  Reviewed since still having hot flashes to continue and try to come off in January, due to cooler weather, after holiday season.  Instructed to call if any further questions.

## 2019-10-15 NOTE — Patient Instructions (Signed)
Take 1/2  Tablet of each day and then every other and then stop

## 2019-10-16 ENCOUNTER — Ambulatory Visit: Payer: Medicare Other | Admitting: Orthopaedic Surgery

## 2019-10-24 ENCOUNTER — Encounter: Payer: Self-pay | Admitting: Orthopaedic Surgery

## 2019-10-24 ENCOUNTER — Ambulatory Visit (INDEPENDENT_AMBULATORY_CARE_PROVIDER_SITE_OTHER): Payer: Medicare Other | Admitting: Orthopaedic Surgery

## 2019-10-24 DIAGNOSIS — M75101 Unspecified rotator cuff tear or rupture of right shoulder, not specified as traumatic: Secondary | ICD-10-CM

## 2019-10-24 MED ORDER — LIDOCAINE HCL 1 % IJ SOLN
3.0000 mL | INTRAMUSCULAR | Status: AC | PRN
Start: 2019-10-24 — End: 2019-10-24
  Administered 2019-10-24: 3 mL

## 2019-10-24 MED ORDER — BUPIVACAINE HCL 0.5 % IJ SOLN
3.0000 mL | INTRAMUSCULAR | Status: AC | PRN
  Administered 2019-10-24: 3 mL via INTRA_ARTICULAR

## 2019-10-24 MED ORDER — METHYLPREDNISOLONE ACETATE 40 MG/ML IJ SUSP
40.0000 mg | INTRAMUSCULAR | Status: AC | PRN
Start: 2019-10-24 — End: 2019-10-24
  Administered 2019-10-24: 16:00:00 40 mg via INTRA_ARTICULAR

## 2019-10-24 NOTE — Progress Notes (Signed)
Office Visit Note   Patient: Rachel Shepherd           Date of Birth: Aug 11, 1951           MRN: [id number] Visit Date: 10/24/2019              Requested by: Eartha Inch, MD [address] [place],  [place] [address] PCP: Eartha Inch, MD   Assessment & Plan: Visit Diagnoses:  1. Nontraumatic tear of supraspinatus tendon, right     Plan: Impression is a small full-thickness anterior supraspinatus tear.  She does not have significant loss in strength.  Since she is having fairly regular pain especially worse with activity and based on her discussion patient would like to try a cortisone injection.  Patient tolerated the injection well.  The MRI was reviewed with the patient in detail today. Total face to face encounter time was greater than 25 minutes and over half of this time was spent in counseling and/or coordination of care.  Follow-Up Instructions: Return if symptoms worsen or fail to improve.   Orders:  Orders Placed This Encounter  Procedures  . Large Joint Inj   No orders of the defined types were placed in this encounter.     Procedures: Large Joint Inj: R subacromial bursa on 10/24/2019 3:57 PM Indications: pain Details: 22 G needle  Arthrogram: No  Medications: 3 mL bupivacaine 0.5 %; 3 mL lidocaine 1 %; 40 mg methylPREDNISolone acetate 40 MG/ML Outcome: tolerated well, no immediate complications Consent was given by the patient. Patient was prepped and draped in the usual sterile fashion.       Clinical Data: No additional findings.   Subjective: Chief Complaint  Patient presents with  . Right Shoulder - Pain    [name] is here for follow-up of her right shoulder pain.  She had an MRI recently that showed small full-thickness anterior supraspinatus tear.  She is having fairly constant pain with activity and worse at night.  She has started a physical therapy regimen recently.   Review of Systems   Objective: Vital Signs: There were no  vitals taken for this visit.  Physical Exam  Ortho Exam Right shoulder exam shows mild weakness to rotator cuff testing secondary to pain. Specialty Comments:  No specialty comments available.  Imaging: No results found.   PMFS History: Patient Active Problem List   Diagnosis Date Noted  . Nontraumatic tear of supraspinatus tendon, right 10/24/2019  . Acute pain of right shoulder 10/13/2017  . Acute pain of left shoulder 10/13/2017  . Acute pain of right knee 04/29/2017  . Impingement syndrome of right shoulder 04/29/2017  . Hypertension   . SUI (stress urinary incontinence, female)   . Atrophic vaginitis   . HSV-2 (herpes simplex virus 2) infection   . Dysplasia of cervix, low grade (CIN 1)   . High risk HPV infection   . PAIN IN JOINT, SITE UNSPECIFIED 12/02/2010  . SLEEP DISORDER 12/02/2010  . MUSCLE PAIN 12/09/2009  . POSTMENOPAUSAL STATUS 11/21/2009  . CHEST DISCOMFORT 09/29/2009  . COLONIC POLYPS, HX OF 07/09/2009  . CHRONIC GRANULOMATOUS DISEASE 04/16/2008   Past Medical History:  Diagnosis Date  . Atrophic vaginitis   . Dysplasia of cervix, 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   HPV-HIGH RISK  . 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  . HSV-2 (herpes simplex virus 2) infection   . Hypertension   . SUI (stress urinary incontinence, female)     Family History  Problem Relation Age of Onset  . Heart disease Mother        PACE MAKER  . Breast cancer Mother        Age 78  . Stroke Paternal Grandmother   . Heart disease Paternal Grandmother     Past Surgical History:  Procedure Laterality Date  . COLPOSCOPY    . CYST REMOVED RIGHT CHEEK    . 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 TUBAL LIGATION    . WRIST SURGERY     Social History   Occupational History  . Not on file  Tobacco Use  . Smoking status: Never Smoker  . Smokeless tobacco: Never Used  Substance and Sexual Activity  . Alcohol use: Yes    Alcohol/week: 14.0 standard drinks    Types: 14 Standard drinks  or equivalent per week  . Drug use: No  . Sexual activity: Not Currently    Birth control/protection: Post-menopausal    Comment: INTERCOURSE AGE 58, SEXUAL PARTNERS 5+++++++++++-+

## 2019-10-25 ENCOUNTER — Telehealth: Payer: Self-pay | Admitting: Orthopaedic Surgery

## 2019-10-25 NOTE — Telephone Encounter (Signed)
Please let her know that she's unfortunately having a post steroid injection flare up.  Use ice, rest, advil 800 mg TID.  Should resolve in a day.  Sorry to hear about that.

## 2019-10-25 NOTE — Telephone Encounter (Signed)
I Called patient to advise

## 2019-10-25 NOTE — Telephone Encounter (Signed)
Patient called stating that her arm is 10x worse than it was before the injection.  CB#[phone].  Thank you.

## 2019-10-25 NOTE — Telephone Encounter (Signed)
See message below °

## 2019-10-30 ENCOUNTER — Other Ambulatory Visit: Payer: Self-pay

## 2019-10-30 ENCOUNTER — Encounter: Payer: Self-pay | Admitting: Women's Health

## 2019-10-30 ENCOUNTER — Ambulatory Visit (INDEPENDENT_AMBULATORY_CARE_PROVIDER_SITE_OTHER): Payer: Medicare Other | Admitting: Women's Health

## 2019-10-30 VITALS — BP 122/70

## 2019-10-30 DIAGNOSIS — N898 Other specified noninflammatory disorders of vagina: Secondary | ICD-10-CM

## 2019-10-30 LAB — WET PREP FOR TRICH, YEAST, CLUE

## 2019-10-30 NOTE — Patient Instructions (Signed)
No infection today  A&D ointment OTC   Vaginitis Vaginitis is a condition in which the vaginal tissue swells and becomes red (inflamed). This condition is most often caused by a change in the normal balance of bacteria and yeast that live in the vagina. This change causes an overgrowth of certain bacteria or yeast, which causes the inflammation. There are different types of vaginitis, but the most common types are:  Bacterial vaginosis.  Yeast infection (candidiasis).  Trichomoniasis vaginitis. This is a sexually transmitted disease (STD).  Viral vaginitis.  Atrophic vaginitis.  Allergic vaginitis. What are the causes? The cause of this condition depends on the type of vaginitis. It can be caused by:  Bacteria (bacterial vaginosis).  Yeast, which is a fungus (yeast infection).  A parasite (trichomoniasis vaginitis).  A virus (viral vaginitis).  Low hormone levels (atrophic vaginitis). Low hormone levels can occur during pregnancy, breastfeeding, or after menopause.  Irritants, such as bubble baths, scented tampons, and feminine sprays (allergic vaginitis). Other factors can change the normal balance of the yeast and bacteria that live in the vagina. These include:  Antibiotic medicines.  Poor hygiene.  Diaphragms, vaginal sponges, spermicides, birth control pills, and intrauterine devices (IUD).  Sex.  Infection.  Uncontrolled diabetes.  A weakened defense (immune) system. What increases the risk? This condition is more likely to develop in women who:  Smoke.  Use vaginal douches, scented tampons, or scented sanitary pads.  Wear tight-fitting pants.  Wear thong underwear.  Use oral birth control pills or an IUD.  Have sex without a condom.  Have multiple sex partners.  Have an STD.  Frequently use the spermicide nonoxynol-9.  Eat lots of foods high in sugar.  Have uncontrolled diabetes.  Have low estrogen levels.  Have a weakened immune system  from an immune disorder or medical treatment.  Are pregnant or breastfeeding. What are the signs or symptoms? Symptoms vary depending on the cause of the vaginitis. Common symptoms include:  Abnormal vaginal discharge. ? The discharge is white, gray, or yellow with bacterial vaginosis. ? The discharge is thick, white, and cheesy with a yeast infection. ? The discharge is frothy and yellow or greenish with trichomoniasis.  A bad vaginal smell. The smell is fishy with bacterial vaginosis.  Vaginal itching, pain, or swelling.  Sex that is painful.  Pain or burning when urinating. Sometimes there are no symptoms. How is this diagnosed? This condition is diagnosed based on your symptoms and medical history. A physical exam, including a pelvic exam, will also be done. You may also have other tests, including:  Tests to determine the pH level (acidity or alkalinity) of your vagina.  A whiff test, to assess the odor that results when a sample of your vaginal discharge is mixed with a potassium hydroxide solution.  Tests of vaginal fluid. A sample will be examined under a microscope. How is this treated? Treatment varies depending on the type of vaginitis you have. Your treatment may include:  Antibiotic creams or pills to treat bacterial vaginosis and trichomoniasis.  Antifungal medicines, such as vaginal creams or suppositories, to treat a yeast infection.  Medicine to ease discomfort if you have viral vaginitis. Your sexual partner should also be treated.  Estrogen delivered in a cream, pill, suppository, or vaginal ring to treat atrophic vaginitis. If vaginal dryness occurs, lubricants and moisturizing creams may help. You may need to avoid scented soaps, sprays, or douches.  Stopping use of a product that is causing allergic vaginitis. Then  using a vaginal cream to treat the symptoms. Follow these instructions at home: Lifestyle  Keep your genital area clean and dry. Avoid soap,  and only rinse the area with water.  Do not douche or use tampons until your health care provider says it is okay to do so. Use sanitary pads, if needed.  Do not have sex until your health care provider approves. When you can return to sex, practice safe sex and use condoms.  Wipe from front to back. This avoids the spread of bacteria from the rectum to the vagina. General instructions  Take over-the-counter and prescription medicines only as told by your health care provider.  If you were prescribed an antibiotic medicine, take or use it as told by your health care provider. Do not stop taking or using the antibiotic even if you start to feel better.  Keep all follow-up visits as told by your health care provider. This is important. How is this prevented?  Use mild, non-scented products. Do not use things that can irritate the vagina, such as fabric softeners. Avoid the following products if they are scented: ? Feminine sprays. ? Detergents. ? Tampons. ? Feminine hygiene products. ? Soaps or bubble baths.  Let air reach your genital area. ? Wear cotton underwear to reduce moisture buildup. ? Avoid wearing underwear while you sleep. ? Avoid wearing tight pants and underwear or nylons without a cotton panel. ? Avoid wearing thong underwear.  Take off any wet clothing, such as bathing suits, as soon as possible.  Practice safe sex and use condoms. Contact a health care provider if:  You have abdominal pain.  You have a fever.  You have symptoms that last for more than 2-3 days. Get help right away if:  You have a fever and your symptoms suddenly get worse. Summary  Vaginitis is a condition in which the vaginal tissue becomes inflamed.This condition is most often caused by a change in the normal balance of bacteria and yeast that live in the vagina.  Treatment varies depending on the type of vaginitis you have.  Do not douche, use tampons , or have sex until your health  care provider approves. When you can return to sex, practice safe sex and use condoms. This information is not intended to replace advice given to you by your health care provider. Make sure you discuss any questions you have with your health care provider. Document Released: 09/26/2007 Document Revised: 11/11/2017 Document Reviewed: 01/04/2017 Elsevier Patient Education  2020 [employer].

## 2019-10-31 LAB — C. TRACHOMATIS/N. GONORRHOEAE RNA
C. trachomatis RNA, TMA: NOT DETECTED
N. gonorrhoeae RNA, TMA: NOT DETECTED

## 2019-10-31 NOTE — Progress Notes (Signed)
68 year old W WF G0 presents with complaint of mostly external vaginal irritation without discharge.  Reports mild vaginal itching without odor.  Denies abdominal/back pain, urinary symptoms or fever.  Recently became sexually active with new partner after no sexual activity for greater than 5 years.  Currently on 0.25 estradiol and Provera with no bleeding and manageable hot flashes.  Primary care manages hypertension.  Exam: Appears well.  No CVAT.  Abdomen soft, nontender, external genitalia with minimal erythema, speculum exam no visible erosion, irritation,  or discharge, wet prep negative.  GC/chlamydia culture taken.  Vaginal irritation most likely from recent sexual activity  Plan: Reviewed importance of using vaginal lubricants, good use A&D ointment externally after showers.  Pat dry after urination, loose clothing.  Reviewed options, vaginal estrogen may help, declines at this time.  Reassurance given.  Declines HIV or RPR.

## 2019-11-06 ENCOUNTER — Other Ambulatory Visit: Payer: Self-pay

## 2019-11-06 DIAGNOSIS — Z20822 Contact with and (suspected) exposure to covid-19: Secondary | ICD-10-CM

## 2019-11-08 LAB — NOVEL CORONAVIRUS, NAA: SARS-CoV-2, NAA: NOT DETECTED

## 2019-11-15 ENCOUNTER — Encounter: Payer: Self-pay | Admitting: Orthopaedic Surgery

## 2019-11-19 ENCOUNTER — Encounter: Payer: Self-pay | Admitting: Orthopaedic Surgery

## 2019-11-20 ENCOUNTER — Other Ambulatory Visit: Payer: Self-pay

## 2019-11-20 DIAGNOSIS — M25511 Pain in right shoulder: Secondary | ICD-10-CM

## 2019-11-20 NOTE — Telephone Encounter (Signed)
Please send to [facility].

## 2019-12-03 ENCOUNTER — Ambulatory Visit (INDEPENDENT_AMBULATORY_CARE_PROVIDER_SITE_OTHER): Payer: Medicare Other | Admitting: Orthopedic Surgery

## 2019-12-03 ENCOUNTER — Encounter: Payer: Self-pay | Admitting: Orthopedic Surgery

## 2019-12-03 ENCOUNTER — Other Ambulatory Visit: Payer: Self-pay

## 2019-12-03 DIAGNOSIS — S46011A Strain of muscle(s) and tendon(s) of the rotator cuff of right shoulder, initial encounter: Secondary | ICD-10-CM | POA: Diagnosis not present

## 2019-12-07 NOTE — Progress Notes (Signed)
Office Visit Note   Patient: Rachel Shepherd           Date of Birth: Oct 28, 1951           MRN: [id number] Visit Date: 12/03/2019 Requested by: Chesley Noon, MD [place],  [place] [address] PCP: Chesley Noon, MD  Subjective: Chief Complaint  Patient presents with  . Right Shoulder - Pain    HPI: Rachel Shepherd is a 68 y.o. female who presents to the office complaining of right shoulder pain.  Patient notes that she fell going up stairs months ago.  Since this incident she has had right shoulder pain.  She was not having any pain prior to the injury.  She notes pain is worse when lifting away from her body.  Pain wakes her up at night.  She takes occasional ibuprofen to treat her symptoms.  Patient is a very active individual and works out 5 to 6 days a week.  She is able to do bicep curls without any pain.  She was seen by Dr. Erlinda Hong who injected her right shoulder with cortisone without any relief and sent her to physical therapy.  She went to 1 session but notes that this made it worse.  She denies any history of shoulder surgery, neck pain, numbness tingling, radicular symptoms..                ROS:  All systems reviewed are negative as they relate to the chief complaint within the history of present illness.  Patient denies fevers or chills.  Assessment & Plan: Visit Diagnoses:  1. Traumatic tear of right rotator cuff, unspecified tear extent, initial encounter     Plan: Patient is a 68 year old female who presents complaining of right shoulder pain following a fall months ago.  She has tried oral anti-inflammatories, cortisone injection without relief.  She does not have any weakness on exam but she does have some grinding with passive range of motion.  Patient had an MRI that revealed a small full-thickness tear of the supraspinatus muscle and of the superior fibers of the subscapularis muscle.  Her main complaint is pain.  Discussed options available to patient  including doing nothing versus continuing oral anti-inflammatories versus continuing physical therapy with a longer trial of PT versus surgery.  After lengthy discussion patient will consider her options and possibly continue her physical therapy.  We discussed at length operative and nonoperative treatment options including the rehabilitation for each.  I think in general because of the size of this tear she could do okay with this for 6 or 8 months.  She is very active.  She is having symptoms and does have a full-thickness tear which is not uncommon for 68 year old patient.  Nonetheless based on her activity level I think surgical fixation would likely offer her the best possibility of doing what she wants to do.  She will consider her options in let us know if she wants to proceed.  She will call the office if she wishes to proceed with surgery.  Patient will follow-up as needed.  Follow-Up Instructions: No follow-ups on file.   Orders:  No orders of the defined types were placed in this encounter.  No orders of the defined types were placed in this encounter.     Procedures: No procedures performed   Clinical Data: No additional findings.  Objective: Vital Signs: There were no vitals taken for this visit.  Physical Exam:  Constitutional: Patient appears  well-developed HEENT:  Head: Normocephalic Eyes:EOM are normal Neck: Normal range of motion Cardiovascular: Normal rate Pulmonary/chest: Effort normal Neurologic: Patient is alert Skin: Skin is warm Psychiatric: Patient has normal mood and affect  Ortho Exam:  Right shoulder Exam Able to fully forward flex and abduct shoulder overhead No loss of ER relative to the other shoulder.  Good endpoint with ER No TTP over the [facility] joint or bicipital groove 5/5 motor strength of the subscapularis, supraspinatus, infraspinatus muscles 5/5 grip strength, forearm pronation/supination, and bicep strength  Specialty Comments:  No  specialty comments available.  Imaging: No results found.   PMFS History: Patient Active Problem List   Diagnosis Date Noted  . Nontraumatic tear of supraspinatus tendon, right 10/24/2019  . Acute pain of right shoulder 10/13/2017  . Acute pain of left shoulder 10/13/2017  . Acute pain of right knee 04/29/2017  . Impingement syndrome of right shoulder 04/29/2017  . Hypertension   . SUI (stress urinary incontinence, female)   . Atrophic vaginitis   . HSV-2 (herpes simplex virus 2) infection   . Dysplasia of cervix, low grade (CIN 1)   . High risk HPV infection   . PAIN IN JOINT, SITE UNSPECIFIED 12/02/2010  . SLEEP DISORDER 12/02/2010  . MUSCLE PAIN 12/09/2009  . POSTMENOPAUSAL STATUS 11/21/2009  . CHEST DISCOMFORT 09/29/2009  . COLONIC POLYPS, HX OF 07/09/2009  . CHRONIC GRANULOMATOUS DISEASE 04/16/2008   Past Medical History:  Diagnosis Date  . Atrophic vaginitis   . Dysplasia of cervix, 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   HPV-HIGH RISK  . 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  . HSV-2 (herpes simplex virus 2) infection   . Hypertension   . SUI (stress urinary incontinence, female)     Family History  Problem Relation Age of Onset  . Heart disease Mother        PACE MAKER  . Breast cancer Mother        Age 21  . Stroke Paternal Grandmother   . Heart disease Paternal Grandmother     Past Surgical History:  Procedure Laterality Date  . COLPOSCOPY    . CYST REMOVED RIGHT CHEEK    . 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 TUBAL LIGATION    . WRIST SURGERY     Social History   Occupational History  . Not on file  Tobacco Use  . Smoking status: Never Smoker  . Smokeless tobacco: Never Used  Substance and Sexual Activity  . Alcohol use: Yes    Alcohol/week: 14.0 standard drinks    Types: 14 Standard drinks or equivalent per week  . Drug use: No  . Sexual activity: Not Currently    Birth control/protection: Post-menopausal    Comment: INTERCOURSE AGE 39, SEXUAL PARTNERS  5+++++++++++-+

## 2019-12-10 ENCOUNTER — Encounter: Payer: Self-pay | Admitting: Orthopedic Surgery

## 2019-12-17 ENCOUNTER — Encounter: Payer: Self-pay | Admitting: Orthopaedic Surgery

## 2019-12-17 ENCOUNTER — Telehealth: Payer: Self-pay | Admitting: Orthopaedic Surgery

## 2019-12-17 NOTE — Telephone Encounter (Signed)
Patient called and requesting a call back. Patient stated to have questions about injury and PT. Patient phone number is [phone].

## 2019-12-19 ENCOUNTER — Encounter: Payer: Self-pay | Admitting: Orthopaedic Surgery

## 2019-12-20 NOTE — Telephone Encounter (Signed)
She sent my chart msg

## 2020-01-08 ENCOUNTER — Ambulatory Visit: Payer: Medicare Other

## 2020-01-17 ENCOUNTER — Ambulatory Visit: Payer: Medicare Other | Attending: Internal Medicine

## 2020-01-17 DIAGNOSIS — Z23 Encounter for immunization: Secondary | ICD-10-CM | POA: Insufficient documentation

## 2020-01-17 NOTE — Progress Notes (Signed)
   Covid-19 Vaccination Clinic  Name:  Rachel Shepherd    MRN: [id number] DOB: 04-17-1951  01/17/2020  [name] was observed post Covid-19 immunization for 15 minutes without incidence. She was provided with Vaccine Information Sheet and instruction to access the V-Safe system.   [name] was instructed to call 911 with any severe reactions post vaccine: Marland Kitchen Difficulty breathing  . Swelling of your face and throat  . A fast heartbeat  . A bad rash all over your body  . Dizziness and weakness    Immunizations Administered    Name Date Dose VIS Date Route   Pfizer COVID-19 Vaccine 01/17/2020 12:20 PM 0.3 mL 11/23/2019 Intramuscular   Manufacturer: [employer], [employer]   Lot: [id number]   NDC: [address]-8

## 2020-01-22 ENCOUNTER — Other Ambulatory Visit: Payer: Self-pay | Admitting: Women's Health

## 2020-01-22 DIAGNOSIS — Z79890 Hormone replacement therapy (postmenopausal): Secondary | ICD-10-CM

## 2020-02-03 ENCOUNTER — Other Ambulatory Visit: Payer: Self-pay | Admitting: Women's Health

## 2020-02-03 DIAGNOSIS — Z79890 Hormone replacement therapy (postmenopausal): Secondary | ICD-10-CM

## 2020-02-04 NOTE — Telephone Encounter (Signed)
Rachel Shepherd had indicated in August visit note that patient would be stopping this and no refills provided. I emailed patient back to ask if she had tried to d/c.

## 2020-02-05 ENCOUNTER — Other Ambulatory Visit: Payer: Self-pay | Admitting: Women's Health

## 2020-02-05 DIAGNOSIS — Z79890 Hormone replacement therapy (postmenopausal): Secondary | ICD-10-CM

## 2020-02-05 MED ORDER — MEDROXYPROGESTERONE ACETATE 10 MG PO TABS: ORAL_TABLET | ORAL | 4 refills | Status: DC

## 2020-02-05 MED ORDER — ESTRADIOL 0.5 MG PO TABS: 0.5000 mg | ORAL_TABLET | Freq: Every day | ORAL | 2 refills | Status: DC

## 2020-02-12 ENCOUNTER — Ambulatory Visit: Payer: Medicare Other | Attending: Internal Medicine

## 2020-02-12 DIAGNOSIS — Z23 Encounter for immunization: Secondary | ICD-10-CM | POA: Insufficient documentation

## 2020-02-12 NOTE — Progress Notes (Signed)
   Covid-19 Vaccination Clinic  Name:  Rachel Shepherd    MRN: [id number] DOB: [birth year]  02/12/2020  [name] was observed post Covid-19 immunization for 15 minutes without incident. She was provided with Vaccine Information Sheet and instruction to access the V-Safe system.   [name] was instructed to call 911 with any severe reactions post vaccine: Marland Kitchen Difficulty breathing  . Swelling of face and throat  . A fast heartbeat  . A bad rash all over body  . Dizziness and weakness   Immunizations Administered    Name Date Dose VIS Date Route   Pfizer COVID-19 Vaccine 02/12/2020  1:56 PM 0.3 mL 11/23/2019 Intramuscular   Manufacturer: [employer], [employer]   Lot: [id number]   NDC: [address]-6

## 2020-06-09 ENCOUNTER — Encounter: Payer: Self-pay | Admitting: Nurse Practitioner

## 2020-07-03 ENCOUNTER — Encounter: Payer: Self-pay | Admitting: Nurse Practitioner

## 2020-07-22 ENCOUNTER — Other Ambulatory Visit: Payer: Self-pay

## 2020-07-22 ENCOUNTER — Encounter: Payer: Self-pay | Admitting: Nurse Practitioner

## 2020-07-22 ENCOUNTER — Ambulatory Visit (INDEPENDENT_AMBULATORY_CARE_PROVIDER_SITE_OTHER): Payer: Medicare Other | Admitting: Nurse Practitioner

## 2020-07-22 VITALS — BP 110/78 | Ht 62.0 in | Wt 121.0 lb

## 2020-07-22 DIAGNOSIS — Z79890 Hormone replacement therapy (postmenopausal): Secondary | ICD-10-CM | POA: Diagnosis not present

## 2020-07-22 DIAGNOSIS — Z9289 Personal history of other medical treatment: Secondary | ICD-10-CM | POA: Diagnosis not present

## 2020-07-22 DIAGNOSIS — Z01419 Encounter for gynecological examination (general) (routine) without abnormal findings: Secondary | ICD-10-CM

## 2020-07-22 DIAGNOSIS — B009 Herpesviral infection, unspecified: Secondary | ICD-10-CM

## 2020-07-22 DIAGNOSIS — Z780 Asymptomatic menopausal state: Secondary | ICD-10-CM

## 2020-07-22 MED ORDER — ACYCLOVIR 200 MG PO CAPS: ORAL_CAPSULE | ORAL | 4 refills | Status: DC

## 2020-07-22 MED ORDER — MEDROXYPROGESTERONE ACETATE 10 MG PO TABS: ORAL_TABLET | ORAL | 4 refills | Status: DC

## 2020-07-22 MED ORDER — ESTRADIOL 0.5 MG PO TABS: 0.5000 mg | ORAL_TABLET | Freq: Every day | ORAL | 2 refills | Status: DC

## 2020-07-22 NOTE — Progress Notes (Signed)
   Rachel Shepherd [birth year] [id number]   History:  69 y.o. G0 presents for breast and pelvic exam without GYN complaints. Postmenopausal - taking Estrace 0.25 mg and Provera for hot flashes and night sweats. She has tried to wean in the past but was unable to tolerate. 2011 CIN-1. HSV-1, on daily suppression management with occasional outbreaks on mouth but none genital. She is very physically active.   Gynecologic History No LMP recorded. Patient is postmenopausal.   Last Pap: 06/29/2017. Results were:  Last mammogram: 06/03/2020. Results were: bilateral calcifications. 06/26/2021 bilateral diagnostic mamm was benign Last colonoscopy: 4 years ago per patient. Results were:  Last Dexa: 04/22/2015. Results were: normal, 5 year follow up recommended  Past medical history, past surgical history, family history and social history were all reviewed and documented in the EPIC chart.  ROS:  A ROS was performed and pertinent positives and negatives are included.  Exam:  Vitals:   07/22/20 1447  BP: 110/78  Weight: 121 lb (54.9 kg)  Height: 5\' 2"  (1.575 m)   Body mass index is 22.13 kg/m.  General appearance:  Normal Thyroid:  Symmetrical, normal in size, without palpable masses or nodularity. Respiratory  Auscultation:  Clear without wheezing or rhonchi Cardiovascular  Auscultation:  Regular rate, without rubs, murmurs or gallops  Edema/varicosities:  Not grossly evident Abdominal  Soft,nontender, without masses, guarding or rebound.  Liver/spleen:  No organomegaly noted  Hernia:  None appreciated  Skin  Inspection:  Grossly normal   Breasts: Examined lying and sitting.   Right: Without masses, retractions, discharge or axillary adenopathy.   Left: Without masses, retractions, discharge or axillary adenopathy. Gentitourinary   Inguinal/mons:  Normal without inguinal adenopathy  External genitalia:  Normal  BUS/Urethra/Skene's glands:  Normal  Vagina:  Normal, mild atrophic  changes  Cervix:  Stenotic  Uterus:  Anteverted, normal in size, shape and contour.  Midline and mobile  Adnexa/parametria:     Rt: Without masses or tenderness.   Lt: Without masses or tenderness.  Anus and perineum: Normal  Digital rectal exam: Normal sphincter tone without palpated masses or tenderness  Assessment/Plan:  69 y.o. G0 for breast and pelvic exam.   Well female exam with routine gynecological exam - Plan: PAP,TP IMGw/HPV RNA,rflx HPVTYPE16,18/45. Education provided on SBEs, importance of preventative screenings, current guidelines, high calcium diet, regular exercise, and multivitamin daily. Annual labs done elsewhere.   Postmenopausal - Plan: DG Bone Density. Normal in 2016. Exercises regularly and incorporates weightbearing exercises.   Postmenopausal HRT (hormone replacement therapy) - Plan: estradiol (ESTRACE) 0.5 MG tablet, medroxyPROGESTERone (PROVERA) 10 MG tablet. She is aware of risks for blood clots, heart attack, stroke, and breast cancer. We agreed to try to wean off therapy and she will let me know how she tolerates.   HSV infection - Plan: acyclovir (ZOVIRAX) 200 MG capsule. Doing well on this. Has had occasional outbreaks on lips due to stress. No genital outbreaks. Refill x 1 year provided.   Follow up in 1 year for annual      2017 [facility], 3:02 PM 07/22/2020

## 2020-07-22 NOTE — Patient Instructions (Signed)

## 2020-07-23 LAB — PAP, TP IMAGING W/ HPV RNA, RFLX HPV TYPE 16,18/45: HPV DNA High Risk: NOT DETECTED

## 2020-09-23 ENCOUNTER — Ambulatory Visit (INDEPENDENT_AMBULATORY_CARE_PROVIDER_SITE_OTHER): Payer: Medicare Other

## 2020-09-23 ENCOUNTER — Other Ambulatory Visit: Payer: Self-pay | Admitting: Nurse Practitioner

## 2020-09-23 ENCOUNTER — Other Ambulatory Visit: Payer: Self-pay

## 2020-09-23 DIAGNOSIS — Z13820 Encounter for screening for osteoporosis: Secondary | ICD-10-CM

## 2020-09-23 DIAGNOSIS — Z780 Asymptomatic menopausal state: Secondary | ICD-10-CM

## 2020-11-12 ENCOUNTER — Other Ambulatory Visit: Payer: Self-pay | Admitting: Nurse Practitioner

## 2020-11-12 DIAGNOSIS — B009 Herpesviral infection, unspecified: Secondary | ICD-10-CM

## 2020-11-12 MED ORDER — ACYCLOVIR 200 MG PO CAPS: ORAL_CAPSULE | ORAL | 4 refills | Status: DC

## 2020-12-08 ENCOUNTER — Other Ambulatory Visit: Payer: Self-pay | Admitting: Nurse Practitioner

## 2020-12-08 DIAGNOSIS — Z79890 Hormone replacement therapy (postmenopausal): Secondary | ICD-10-CM

## 2020-12-08 MED ORDER — MEDROXYPROGESTERONE ACETATE 10 MG PO TABS: ORAL_TABLET | ORAL | 3 refills | Status: DC

## 2021-07-23 ENCOUNTER — Ambulatory Visit: Payer: Self-pay | Admitting: Nurse Practitioner

## 2021-07-29 ENCOUNTER — Other Ambulatory Visit: Payer: Self-pay | Admitting: Nurse Practitioner

## 2021-07-29 DIAGNOSIS — Z79890 Hormone replacement therapy (postmenopausal): Secondary | ICD-10-CM

## 2021-07-29 NOTE — Telephone Encounter (Signed)
Last AEX 07/22/20 Scheduled AEX 08/20/21

## 2021-08-20 ENCOUNTER — Other Ambulatory Visit: Payer: Self-pay

## 2021-08-20 ENCOUNTER — Ambulatory Visit (INDEPENDENT_AMBULATORY_CARE_PROVIDER_SITE_OTHER): Payer: Medicare Other | Admitting: Nurse Practitioner

## 2021-08-20 ENCOUNTER — Encounter: Payer: Self-pay | Admitting: Nurse Practitioner

## 2021-08-20 VITALS — BP 120/74 | Ht 62.0 in | Wt 120.0 lb

## 2021-08-20 DIAGNOSIS — Z01419 Encounter for gynecological examination (general) (routine) without abnormal findings: Secondary | ICD-10-CM

## 2021-08-20 DIAGNOSIS — B009 Herpesviral infection, unspecified: Secondary | ICD-10-CM

## 2021-08-20 DIAGNOSIS — Z79890 Hormone replacement therapy (postmenopausal): Secondary | ICD-10-CM

## 2021-08-20 MED ORDER — ACYCLOVIR 200 MG PO CAPS: ORAL_CAPSULE | ORAL | 4 refills | Status: DC

## 2021-08-20 NOTE — Progress Notes (Signed)
   Rachel Shepherd 1951-09-24 [id number]   History:  70 y.o. G0 presents for breast and pelvic exam without GYN complaints. Postmenopausal - taking Estrace 0.25 mg and Provera for hot flashes and night sweats.  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 She is very physically active with yoga, pickle ball, and has a Systems analyst.   Gynecologic History No LMP recorded. Patient is postmenopausal.   Health maintenance Last Pap: 07/22/2020. Results were: Normal Last mammogram: 06/2021. Results were: Normal Last colonoscopy: 2015. Results were: Normal Last Dexa: 09/23/2020. Results were: normal, 5 year follow up recommended  Past medical history, past surgical history, family history and social history were all reviewed and documented in the EPIC chart. Widowed. Toy poodle.   ROS:  A ROS was performed and pertinent positives and negatives are included.  Exam:  Vitals:   08/20/21 1327  BP: 120/74  Weight: 120 lb (54.4 kg)  Height: 5\' 2"  (1.575 m)   Body mass index is 21.95 kg/m.  General appearance:  Normal Thyroid:  Symmetrical, normal in size, without palpable masses or nodularity. Respiratory  Auscultation:  Clear without wheezing or rhonchi Cardiovascular  Auscultation:  Regular rate, without rubs, murmurs or gallops  Edema/varicosities:  Not grossly evident Abdominal  Soft,nontender, without masses, guarding or rebound.  Liver/spleen:  No organomegaly noted  Hernia:  None appreciated  Skin  Inspection:  Grossly normal   Breasts: Examined lying and sitting.   Right: Without masses, retractions, discharge or axillary adenopathy.   Left: Without masses, retractions, discharge or axillary adenopathy. Gentitourinary   Inguinal/mons:  Normal without inguinal adenopathy  External genitalia:  Normal  BUS/Urethra/Skene's glands:  Normal  Vagina:  Atrophic changes  Cervix:  Stenotic  Uterus:  Normal in size, shape  and contour.  Midline and mobile  Adnexa/parametria:     Rt: Without masses or tenderness.   Lt: Without masses or tenderness.  Anus and perineum: Normal  Digital rectal exam: Normal sphincter tone without palpated masses or tenderness  Patient informed chaperone available to be present for breast and pelvic exam. Patient has requested no chaperone to be present. Patient has been advised what will be completed during breast and pelvic exam.   Assessment/Plan:  70 y.o. G0 for breast and pelvic exam.   Well female exam with routine gynecological exam - Education provided on SBEs, importance of preventative screenings, current guidelines, high calcium diet, regular exercise, and multivitamin daily. Labs with PCP.   Hormone replacement therapy - Taking 1/2 tablet of Estradiol 0.5 mg daily with Provera. We discussed risks of continued use and she is agreeable to try to wean. She will take 1/2 tablet every other day for 1-2 weeks, then stop.   HSV-1 (herpes simplex virus 1) infection - Plan: acyclovir (ZOVIRAX) 200 MG capsule twice daily. Occasional oral outbreaks.   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 Will repeat at 5-year interval per guidelines.   Screening for breast cancer - Normal mammogram history.  Continue annual screenings.  Normal breast exam today.  Screening for colon cancer - 2015 colonoscopy. Will repeat at GI's recommended interval.   Screening for osteoporosis - Normal bone density 09/2020. She is very active. Continue regular exercise and Vitamin D + Calcium supplements.   Follow up in 1 year for annual      10/2020 [facility], 1:48 PM 08/20/2021

## 2021-10-14 ENCOUNTER — Other Ambulatory Visit: Payer: Self-pay | Admitting: Nurse Practitioner

## 2021-10-14 DIAGNOSIS — Z79890 Hormone replacement therapy (postmenopausal): Secondary | ICD-10-CM

## 2021-10-16 ENCOUNTER — Other Ambulatory Visit: Payer: Self-pay | Admitting: Nurse Practitioner

## 2021-10-16 DIAGNOSIS — Z79890 Hormone replacement therapy (postmenopausal): Secondary | ICD-10-CM

## 2021-10-16 NOTE — Telephone Encounter (Signed)
Called patient per note on 08/2021 she was trying to wean. Patient reports she tried and started having hot flashes at night. She is still doing 1/2 tablet,but asked if she could get a 90 day supply to try weaning again? Please advise

## 2021-11-10 ENCOUNTER — Encounter: Payer: Self-pay | Admitting: Nurse Practitioner

## 2021-11-11 ENCOUNTER — Other Ambulatory Visit: Payer: Self-pay | Admitting: Nurse Practitioner

## 2021-11-11 DIAGNOSIS — Z79890 Hormone replacement therapy (postmenopausal): Secondary | ICD-10-CM

## 2021-11-11 MED ORDER — MEDROXYPROGESTERONE ACETATE 10 MG PO TABS: 10.0000 mg | ORAL_TABLET | Freq: Every day | ORAL | 2 refills | Status: DC

## 2022-02-01 ENCOUNTER — Other Ambulatory Visit: Payer: Self-pay | Admitting: Nurse Practitioner

## 2022-02-01 DIAGNOSIS — B009 Herpesviral infection, unspecified: Secondary | ICD-10-CM

## 2022-05-03 ENCOUNTER — Other Ambulatory Visit: Payer: Self-pay | Admitting: Nurse Practitioner

## 2022-05-03 DIAGNOSIS — Z79890 Hormone replacement therapy (postmenopausal): Secondary | ICD-10-CM

## 2022-05-03 NOTE — Telephone Encounter (Signed)
AEX 08/20/21.  08/20/2021  Hormone replacement therapy - Taking 1/2 tablet of Estradiol 0.5 mg daily with Provera. We discussed risks of continued use and she is agreeable to try to wean. She will take 1/2 tablet every other day for 1-2 weeks, then stop.   I called patient and spoke with her. She is taking 1/2 tab daily but has not weaned off because of night sweats persisting but manageable on this dose. Wants to continue on for now.

## 2022-08-06 ENCOUNTER — Encounter: Payer: Self-pay | Admitting: Nurse Practitioner

## 2022-08-09 ENCOUNTER — Encounter: Payer: Self-pay | Admitting: Nurse Practitioner

## 2022-08-23 ENCOUNTER — Ambulatory Visit: Payer: Medicare Other | Admitting: Nurse Practitioner

## 2022-08-24 ENCOUNTER — Ambulatory Visit (INDEPENDENT_AMBULATORY_CARE_PROVIDER_SITE_OTHER): Payer: Medicare Other | Admitting: Nurse Practitioner

## 2022-08-24 ENCOUNTER — Telehealth: Payer: Self-pay

## 2022-08-24 ENCOUNTER — Encounter: Payer: Self-pay | Admitting: Nurse Practitioner

## 2022-08-24 VITALS — BP 128/80 | HR 73

## 2022-08-24 DIAGNOSIS — N3941 Urge incontinence: Secondary | ICD-10-CM

## 2022-08-24 NOTE — Progress Notes (Signed)
   Acute Office Visit  Subjective:    Patient ID: Rachel Shepherd, female    DOB: 1951-08-17, 71 y.o.   MRN: [id number]   HPI 71 y.o. presents today for urge incontinence. She has times where she gets a sudden urge to urinate and if she does not get to the bathroom right away she will leak. Denies dysuria, frequency, hematuria. Denies stress incontinence. Feels she is able to fully empty her bladder each time. Very active. She drinks over 100 ounces of water per day. Voids a few times at night but does drink water when she wakes each time. A friend of hers is planning to do sacral neuromodulation therapy and wants to discuss as option.    Review of Systems  Constitutional: Negative.   Genitourinary:  Negative for difficulty urinating, dysuria, enuresis, flank pain, frequency, hematuria, pelvic pain and urgency.       Urge incontinence       Objective:    Physical Exam Constitutional:      Appearance: Normal appearance.  Genitourinary:    General: Normal vulva.     Vagina: Normal.     Cervix: Normal.     Uterus: Normal.      BP 128/80   Pulse 73   SpO2 97%  Wt Readings from Last 3 Encounters:  08/20/21 120 lb (54.4 kg)  07/22/20 121 lb (54.9 kg)  09/05/19 103 lb (46.7 kg)        Patient informed chaperone available to be present for breast and/or pelvic exam. Patient has requested no chaperone to be present. Patient has been advised what will be completed during breast and pelvic exam.   Assessment & Plan:   Problem List Items Addressed This Visit   None Visit Diagnoses     Mixed incontinence urge and stress    -  Primary   Urge incontinence          Plan: Normal exam, no evidence of prolapse. Discussed lifestyle changes such as voiding more often to prevent overfilling of bladder, no fluids a couple of hours before bed, avoid drinking during the night. Also recommend pelvic floor PT and she is agreeable. We did briefly discuss sacral neuromodulation therapy as  requested and will consider if no improvement with recommendations.     Olivia Mackie DNP, 2:27 PM 08/24/2022

## 2022-08-24 NOTE — Telephone Encounter (Signed)
Pelvic floor PT Received: Today Olivia Mackie, NP  P [facility] Triage Please send referral for pelvic floor PT for urge incontinence. Thanks.

## 2022-08-24 NOTE — Telephone Encounter (Signed)
Amb ref placed for PT at [facility].

## 2022-09-09 ENCOUNTER — Telehealth: Payer: Self-pay | Admitting: *Deleted

## 2022-09-09 DIAGNOSIS — B009 Herpesviral infection, unspecified: Secondary | ICD-10-CM

## 2022-09-09 MED ORDER — ACYCLOVIR 200 MG PO CAPS: ORAL_CAPSULE | ORAL | 0 refills | Status: DC

## 2022-09-09 NOTE — Telephone Encounter (Signed)
Provider her with a 30-day supply and had her schedule OV. She needs to schedule visit this year since we prescribe HRT.

## 2022-09-09 NOTE — Telephone Encounter (Signed)
Patient called left message in triage voicemail requesting refill on acyclovir 200 mg capsule. Last annual exam was 08/20/21, no exam scheduled. [facility]. If you approve Rx I will send to pharmacy once I confirm pharmacy. Please advise

## 2022-09-09 NOTE — Telephone Encounter (Signed)
Patient informed with below, Rx sent. Message sent to appointments to schedule annual exam.

## 2022-09-12 ENCOUNTER — Other Ambulatory Visit: Payer: Self-pay | Admitting: Nurse Practitioner

## 2022-09-12 DIAGNOSIS — B009 Herpesviral infection, unspecified: Secondary | ICD-10-CM

## 2022-09-14 NOTE — Telephone Encounter (Signed)
Patient is scheduled 09/29/22 at 2:00pm.

## 2022-09-20 ENCOUNTER — Ambulatory Visit (INDEPENDENT_AMBULATORY_CARE_PROVIDER_SITE_OTHER): Payer: Medicare Other | Admitting: Nurse Practitioner

## 2022-09-20 ENCOUNTER — Encounter: Payer: Self-pay | Admitting: Nurse Practitioner

## 2022-09-20 VITALS — BP 112/78 | HR 69 | Ht 61.75 in | Wt 120.0 lb

## 2022-09-20 DIAGNOSIS — Z79890 Hormone replacement therapy (postmenopausal): Secondary | ICD-10-CM

## 2022-09-20 DIAGNOSIS — B009 Herpesviral infection, unspecified: Secondary | ICD-10-CM

## 2022-09-20 DIAGNOSIS — Z9189 Other specified personal risk factors, not elsewhere classified: Secondary | ICD-10-CM

## 2022-09-20 DIAGNOSIS — Z01419 Encounter for gynecological examination (general) (routine) without abnormal findings: Secondary | ICD-10-CM

## 2022-09-20 MED ORDER — ACYCLOVIR 200 MG PO CAPS: ORAL_CAPSULE | ORAL | 3 refills | Status: DC

## 2022-09-20 NOTE — Progress Notes (Signed)
   Rachel Shepherd [birth year] [id number]   History:  71 y.o. G0 presents for breast and pelvic exam. Postmenopausal - on HRT for hot flashes and night sweats. Only taking 0.25 mg Estradiol daily. Last year it was recommended she try to wean. She reports no hot flashes during the day but does get hot at night, not to the point of being sweaty. 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 HSV-1, takes daily, occasional oral outbreaks. Plans to start pelvic floor PT for urge incontinence. Acquired Covid 2 weeks ago after cruise, doing better. Very active with yoga, pickle ball, and has a Physiological scientist.   Gynecologic History No LMP recorded. Patient is postmenopausal.   Contraception: post menopausal status Sexually active: Yes  Health Maintenance Last Pap: 07/22/2020. Results were: Normal neg HPV Last mammogram: 07/13/2022. Results were: Normal per patient Last colonoscopy: 08/05/2020. 5-year recall Last Dexa: 07/13/2022. Results were: Normal per patient  Past medical history, past surgical history, family history and social history were all reviewed and documented in the EPIC chart. Widowed. Husband passed from liver cancer a couple of years ago. Has toy poodle.   ROS:  A ROS was performed and pertinent positives and negatives are included.  Exam:  Vitals:   09/20/22 1207  BP: 112/78  Pulse: 69  SpO2: 97%  Weight: 120 lb (54.4 kg)  Height: 5' 1.75" (1.568 m)   Body mass index is 22.13 kg/m.  General appearance:  Normal Thyroid:  Symmetrical, normal in size, without palpable masses or nodularity. Respiratory  Auscultation:  Clear without wheezing or rhonchi Cardiovascular  Auscultation:  Regular rate, without rubs, murmurs or gallops  Edema/varicosities:  Not grossly evident Abdominal  Soft,nontender, without masses, guarding or rebound.  Liver/spleen:  No organomegaly noted  Hernia:  None appreciated  Skin  Inspection:  Grossly normal Breasts: Examined lying and  sitting.   Right: Without masses, retractions, nipple discharge or axillary adenopathy.   Left: Without masses, retractions, nipple discharge or axillary adenopathy. Genitourinary Not indicated (done 08/24/22 with no changes)  Patient informed chaperone available to be present for breast and pelvic exam. Patient has requested no chaperone to be present. Patient has been advised what will be completed during breast and pelvic exam.   Assessment/Plan:  71 y.o. G0 for breast and pelvic exam.   Encounter for breast and pelvic examination - Education provided on SBEs, importance of preventative screenings, current guidelines, high calcium diet, regular exercise, and multivitamin daily. Labs with PCP.   Postmenopausal hormone therapy - Only taking 0.25 mg Estradiol daily with Provera 10 mg. She reports no hot flashes during the day but does get hot at night, not to the point of being sweaty. Discussed weaning to see how she does and she is agreeable.  HSV infection - Plan: acyclovir (ZOVIRAX) 200 MG capsule BID daily. Occasional oral outbreaks. Sexually active and prefers taking suppressively.  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 No longer screening per guidelines.   Screening for breast cancer - Normal mammogram history.  Continue annual screenings.  Normal breast exam today.  Screening for colon cancer - 2021 colonoscopy. Will repeat at 5-year interval per GI's recommendation.   Screening for osteoporosis - Normal bone density in August 2023.   Return in 1 year for breast and pelvic exam.     Tamela Gammon DNP, 12:43 PM 09/20/2022

## 2022-09-22 ENCOUNTER — Encounter: Payer: Self-pay | Admitting: Nurse Practitioner

## 2022-09-29 ENCOUNTER — Other Ambulatory Visit: Payer: Self-pay

## 2022-09-29 ENCOUNTER — Encounter: Payer: Self-pay | Admitting: Physical Therapy

## 2022-09-29 ENCOUNTER — Ambulatory Visit: Payer: Medicare Other | Attending: Nurse Practitioner | Admitting: Physical Therapy

## 2022-09-29 DIAGNOSIS — N3941 Urge incontinence: Secondary | ICD-10-CM | POA: Diagnosis not present

## 2022-09-29 DIAGNOSIS — R279 Unspecified lack of coordination: Secondary | ICD-10-CM | POA: Diagnosis not present

## 2022-09-29 DIAGNOSIS — R278 Other lack of coordination: Secondary | ICD-10-CM

## 2022-09-29 NOTE — Therapy (Signed)
OUTPATIENT PHYSICAL THERAPY FEMALE PELVIC EVALUATION   Patient Name: Rachel Shepherd MRN: [id number] DOB:1951-10-05, 71 y.o., female Today's Date: 09/29/2022   PT End of Session - 09/29/22 1645     Visit Number 1    Date for PT Re-Evaluation 12/08/22    Authorization Type Medicare    Authorization - Visit Number 1    Authorization - Number of Visits 10    PT Start Time 1400    PT Stop Time 1440    PT Time Calculation (min) 40 min    Activity Tolerance Patient tolerated treatment well    Behavior During Therapy WFL for tasks assessed/performed             Past Medical History:  Diagnosis Date   Atrophic vaginitis    Dysplasia of cervix, 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   HPV-HIGH RISK   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   HSV-2 (herpes simplex virus 2) infection    Hypertension    SUI (stress urinary incontinence, female)    Past Surgical History:  Procedure Laterality Date   COLPOSCOPY     CYST REMOVED RIGHT CHEEK     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  2009   TUBAL LIGATION     WRIST SURGERY     Patient Active Problem List   Diagnosis Date Noted   Nontraumatic tear of supraspinatus tendon, right 10/24/2019   Acute pain of right shoulder 10/13/2017   Acute pain of left shoulder 10/13/2017   Acute pain of right knee 04/29/2017   Impingement syndrome of right shoulder 04/29/2017   Hypertension    SUI (stress urinary incontinence, female)    Atrophic vaginitis    HSV-2 (herpes simplex virus 2) infection    Dysplasia of cervix, low grade (CIN 1)    High risk HPV infection    PAIN IN JOINT, SITE UNSPECIFIED 12/02/2010   SLEEP DISORDER 12/02/2010   MUSCLE PAIN 12/09/2009   POSTMENOPAUSAL STATUS 11/21/2009   CHEST DISCOMFORT 09/29/2009   COLONIC POLYPS, HX OF 07/09/2009   CHRONIC GRANULOMATOUS DISEASE 04/16/2008    PCP: Eartha Inch, MD  REFERRING PROVIDER: Olivia Mackie, NP  REFERRING DIAG: N39.41 (ICD-10-CM) - Urge incontinence of urine  THERAPY DIAG:   Other lack of coordination  Rationale for Evaluation and Treatment Rehabilitation  ONSET DATE: 2022  SUBJECTIVE:                                                                                                                                                                                           SUBJECTIVE STATEMENT: When she gets into her house she has to get to the  bathroom right away Leakage with sit ups Fluid intake: Yes: water    PRECAUTIONS: None  WEIGHT BEARING RESTRICTIONS No  FALLS:  Has patient fallen in last 6 months? No  LIVING ENVIRONMENT: Lives with: lives with their family  OCCUPATION: pickle ball, Physiological scientist, yoga  PLOF: Independent  PATIENT GOALS  reduce leakage and learn how to prevent leakage  PERTINENT HISTORY:  Dysplasia of cervix 05/2010; high risk of HPV infeciton  BOWEL MOVEMENT no issues  URINATION Pain with urination: No Fully empty bladder: Yes:   Stream: Strong Urgency: Yes: when she just walks into the house Frequency: every 3 hours Leakage: Urge to void and as she walks to the bathroom, abdominal exercises Pads: No  INTERCOURSE no pain with intercourse     OBJECTIVE:   DIAGNOSTIC FINDINGS:  none  PATIENT SURVEYS:  [id number] 13  COGNITION:  Overall cognitive status: Within functional limits for tasks assessed     SENSATION:  Light touch: Appears intact  Proprioception: Appears intact               POSTURE: No Significant postural limitations   PELVIC ALIGNMENT: Bilateral ASIS; sacrum rotated right  LUMBARAROM/PROM full lumbar ROM    LOWER EXTREMITY ROM:  Passive ROM Right eval Left eval  Hip external rotation 45 68   (Blank rows = not tested)  LOWER EXTREMITY MMT:  MMT Right eval Left eval  Hip extension 4/5     PALPATION:   General  Patient needs tactile cues to contract the abdominals without contracting the gluteals and raising the rib cage Tenderness located in the right gluteus medius, and  into the right buttocks L5 rotated right                External Perineal Exam intact                             Internal Pelvic Floor no tenderness  Patient confirms identification and approves PT to assess internal pelvic floor and treatment Yes  PELVIC MMT:   MMT eval  Vaginal 3/5 supine; standing 2/5  (Blank rows = not tested)        TONE: average  PROLAPSE: none  TODAY'S TREATMENT  EVAL Date: 09/29/2022 HEP established-see below    PATIENT EDUCATION:  Education details: educated patient on urge to void and vaginal lubricants, gave patient samples of lubricants Person educated: Patient Education method: Explanation, handouts Education comprehension: verbalized understanding   HOME EXERCISE PROGRAM: See above.   ASSESSMENT:  CLINICAL IMPRESSION: Patient is a 71 y.o. female who was seen today for physical therapy evaluation and treatment for Urge incontinence of urine. Patient report she has had issues of urge incontinence for the past year. She will get home and have to rush to the bathroom immediately or she will leak urine. She has started to leak urine with sit ups. Pelvic floor strength is 3/5 in supine but has trouble engaging the lower abdominals fully with pelvic floor contraction. Pelvic floor strength in standing is 2/5. Patient contracts the abdominals with gluteal contraction and lifting her rib cage upward. Patient has tenderness located in the right gluteus medius, and into the right buttocks. Patient sacrum is rotated right. Her L5 is rotated right. Patient will benefit from skilled therapy to improve pelvic floor strength to reduce her leakage with urge to void and abdominal sit ups.    OBJECTIVE IMPAIRMENTS decreased activity tolerance, decreased coordination, decreased endurance, and  decreased strength.   ACTIVITY LIMITATIONS continence and walking in the door after being out.   PARTICIPATION LIMITATIONS: shopping  PERSONAL FACTORS 1-2  comorbidities: Dysplasia of cervix 05/2010; high risk of HPV infeciton  are also affecting patient's functional outcome.   REHAB POTENTIAL: Excellent  CLINICAL DECISION MAKING: Stable/uncomplicated  EVALUATION COMPLEXITY: Low   GOALS: Goals reviewed with patient? Yes  SHORT TERM GOALS: Target date: 10/27/2022  Patient independent with urge to void behavorial technique  Baseline: Goal status: INITIAL   LONG TERM GOALS: Target date: 12/22/2022   Patient independent with advanced HEP for pelvic floor and core to reduce urinary leakage.  Baseline:  Goal status: INITIAL  2.  Patient is able to walk in the door after being out and slowly walk to the bathroom without leaking urine.  Baseline:  Goal status: INITIAL  3.  Patient is able to perform sit ups with her workout without leaking urine and engaging her lower abdomen correctly.  Baseline:  Goal status: INITIAL  4.  Standing pelvic floor strength is >/= 3/5 to reduce her leakage while walking to the commode after coming home from the store.  Baseline:  Goal status: INITIAL   PLAN: PT FREQUENCY: 1x/week  PT DURATION: 12 weeks  PLANNED INTERVENTIONS: Therapeutic exercises, Therapeutic activity, Neuromuscular re-education, Patient/Family education, Self Care, Biofeedback, and Manual therapy  PLAN FOR NEXT SESSION: work on pelvic floor strength in supine with abdominal engagement, go over lubricants and the urge to void.     Eulis Foster, PT 09/29/22 4:46 PM

## 2022-09-29 NOTE — Patient Instructions (Signed)
Lubrication Used for intercourse to reduce friction Avoid ones that have glycerin, nonoxynol-9, petroleum, propylene glycol, chlorhexidine gluconate, warming gels, tingling gels, icing or cooling gel, scented Avoid parabens due to a preservative similar to female sex hormone May need to be reapplied once or several times during sexual activity Can be applied to both partners genitals prior to vaginal penetration to minimize friction or irritation Prevent irritation and mucosal tears that cause post coital pain and increased the risk of vaginal and urinary tract infections Oil-based lubricants cannot be used with condoms due to breaking them down.  Least likely to irritate vaginal tissue.  Plant based-lubes are safe Silicone-based lubrication are thicker and last long and used for post-menopausal women  Vaginal Lubricators Here is a list of some suggested lubricators you can use for intercourse. Use the most hypoallergenic product.  You can place on you or your partner.  Slippery Stuff ( water based) Sylk or Sliquid Natural H2O ( good  if frequent UTI's)- [employer]mart, amazon Sliquid organics silk-(aloe and silicone based ) [employer] (www.blossom-organics.com)- (aloe based ) Coconut oil, olive oil -not good with condoms  PJur Woman Nude- (water based) amazon Uberlube- ( silicon) Amazon Aloe Vera- Sprouts has an organic one Yes lubricant- (water based and has plant oil based similar to silicone) [employer] Platinum-Silicone, [employer], [facility] Olive and Bee intimate cream-  www.oliveandbee.com.au Pink - [employer] Erosense Sync- walmart, amazon Coconu- coconu.com [employer] to avoid in lubricants are glycerin, warming gels, tingling gels, icing or cooling  gels, and scented gels.  Also avoid Vaseline. KY jelly,  and Astroglide contain chlorhexidine which kills good bacteria(lactobacilli)  Things to avoid in the vaginal area Do not use things to irritate the  vulvar area No lotions- see below Soaps you  can use :Aveeno, Calendula, Good Clean Love cleanser if needed. Must be gentle No deodorants No douches Good to sleep without underwear to let the vaginal area to air out No scrubbing: spread the lips to let warm water rinse over labias and pat dry  Creams that can be used on the Vulva Area V [employer], walmart Vital V [place] Salve Julva- [employer] Botanical Pro-Meno [place] Cream Coconut oil, olive oil Cleo by [employer] labial moisturizer -Amazon,  [place] Releveum ( lidocaine) or [place] Gele Yes Moisturizer      Urge Incontinence  Ideal urination frequency is every 2-4 wakeful hours, which equates to 5-8 times within a 24-hour period.   Urge incontinence is leakage that occurs when the bladder muscle contracts, creating a sudden need to go before getting to the bathroom.   Going too often when your bladder isn't actually full can disrupt the body's automatic signals to store and hold urine longer, which will increase urgency/frequency.  In this case, the bladder "is running the show" and strategies can be learned to retrain this pattern.   One should be able to control the first urge to urinate, at around .  The bladder can hold up to a "grande latte," or . To help you gain control, practice the Urge Drill below when urgency strikes.  This drill will help retrain your bladder signals and allow you to store and hold urine longer.  The overall goal is to stretch out your time between voids to reach a more manageable voiding schedule.    Practice your "quick flicks" often throughout the day (each waking hour) even when you don't need feel the urge to go.  This will  help strengthen your pelvic floor muscles, making them more effective in controlling leakage.  Urge Drill  When you feel an urge to go, follow these steps to regain control: Stop what you are doing and be still Take one deep breath, directing your  air into your abdomen Think an affirming thought, such as "I've got this." Do 5 quick flicks of your pelvic floor Go up and down on your heels hard 5 times Walk with control to the bathroom to void, or delay voiding If you have the urge again repeat above.  [facility] [address] [place], [place] [address] Phone # [phone] Fax [phone]

## 2022-09-30 ENCOUNTER — Encounter: Payer: Self-pay | Admitting: Nurse Practitioner

## 2022-10-11 ENCOUNTER — Encounter: Payer: Self-pay | Admitting: Nurse Practitioner

## 2022-10-11 MED ORDER — ACYCLOVIR 200 MG PO CAPS: 200.0000 mg | ORAL_CAPSULE | Freq: Two times a day (BID) | ORAL | 0 refills | Status: DC

## 2022-10-11 NOTE — Telephone Encounter (Signed)
[place] set.

## 2022-10-18 ENCOUNTER — Encounter: Payer: Self-pay | Admitting: Nurse Practitioner

## 2022-11-08 ENCOUNTER — Other Ambulatory Visit: Payer: Self-pay | Admitting: Nurse Practitioner

## 2022-11-08 DIAGNOSIS — Z79890 Hormone replacement therapy (postmenopausal): Secondary | ICD-10-CM

## 2022-11-08 MED ORDER — MEDROXYPROGESTERONE ACETATE 5 MG PO TABS: 5.0000 mg | ORAL_TABLET | Freq: Every day | ORAL | 3 refills | Status: DC

## 2022-11-08 MED ORDER — ESTRADIOL 0.5 MG PO TABS: 0.2500 mg | ORAL_TABLET | Freq: Every day | ORAL | 1 refills | Status: DC

## 2022-11-10 ENCOUNTER — Encounter: Payer: Medicare Other | Admitting: Physical Therapy

## 2022-11-24 ENCOUNTER — Encounter: Payer: Self-pay | Admitting: Physical Therapy

## 2022-11-24 ENCOUNTER — Ambulatory Visit: Payer: Medicare Other | Attending: Nurse Practitioner | Admitting: Physical Therapy

## 2022-11-24 DIAGNOSIS — R278 Other lack of coordination: Secondary | ICD-10-CM

## 2022-11-24 NOTE — Therapy (Signed)
OUTPATIENT PHYSICAL THERAPY TREATMENT NOTE   Patient Name: Rachel Shepherd MRN: [id number] DOB:1951/03/10, 71 y.o., female Today's Date: 11/24/2022  PCP: Chesley Noon, MD  REFERRING PROVIDER: Tamela Gammon, NP   END OF SESSION:   PT End of Session - 11/24/22 1531     Visit Number 2    Date for PT Re-Evaluation 12/08/22    Authorization Type Medicare    Authorization - Visit Number 2    Authorization - Number of Visits 10    PT Start Time 5397    PT Stop Time 1610    PT Time Calculation (min) 40 min    Activity Tolerance Patient tolerated treatment well    Behavior During Therapy WFL for tasks assessed/performed             Past Medical History:  Diagnosis Date   Atrophic vaginitis    Dysplasia of cervix, 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   HPV-HIGH RISK   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   HSV-2 (herpes simplex virus 2) infection    Hypertension    SUI (stress urinary incontinence, female)    Past Surgical History:  Procedure Laterality Date   COLPOSCOPY     CYST REMOVED RIGHT CHEEK     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  2009   TUBAL LIGATION     WRIST SURGERY     Patient Active Problem List   Diagnosis Date Noted   Nontraumatic tear of supraspinatus tendon, right 10/24/2019   Acute pain of right shoulder 10/13/2017   Acute pain of left shoulder 10/13/2017   Acute pain of right knee 04/29/2017   Impingement syndrome of right shoulder 04/29/2017   Hypertension    SUI (stress urinary incontinence, female)    Atrophic vaginitis    HSV-2 (herpes simplex virus 2) infection    Dysplasia of cervix, low grade (CIN 1)    High risk HPV infection    PAIN IN JOINT, SITE UNSPECIFIED 12/02/2010   SLEEP DISORDER 12/02/2010   MUSCLE PAIN 12/09/2009   POSTMENOPAUSAL STATUS 11/21/2009   CHEST DISCOMFORT 09/29/2009   COLONIC POLYPS, HX OF 07/09/2009   CHRONIC GRANULOMATOUS DISEASE 04/16/2008   REFERRING DIAG: N39.41 (ICD-10-CM) - Urge incontinence of urine   THERAPY  DIAG:  Other lack of coordination   Rationale for Evaluation and Treatment Rehabilitation   ONSET DATE: 2022   SUBJECTIVE:                                                                                                                                                                                            SUBJECTIVE STATEMENT: Urgency is less. I  am using the technique to reduce the urge. I had a few episodes of leakage with a big belly laugh and just a drop. When I blew my nose a tiny bit comes out. I am doing better with less leakage with my sit ups.  Fluid intake: Yes: water     PRECAUTIONS: None   WEIGHT BEARING RESTRICTIONS No   FALLS:  Has patient fallen in last 6 months? No   LIVING ENVIRONMENT: Lives with: lives with their family   OCCUPATION: pickle ball, Physiological scientist, yoga   PLOF: Independent   PATIENT GOALS  reduce leakage and learn how to prevent leakage   PERTINENT HISTORY:  Dysplasia of cervix 05/2010; high risk of HPV infeciton   BOWEL MOVEMENT no issues   URINATION Pain with urination: No Fully empty bladder: Yes:   Stream: Strong Urgency: Yes: when she just walks into the house Frequency: every 3 hours Leakage: Urge to void and as she walks to the bathroom, abdominal exercises Pads: No   INTERCOURSE no pain with intercourse      OBJECTIVE: (objective measures completed at initial evaluation unless otherwise dated)    DIAGNOSTIC FINDINGS:  none   PATIENT SURVEYS:  [id number] 13   COGNITION:            Overall cognitive status: Within functional limits for tasks assessed                          SENSATION:            Light touch: Appears intact            Proprioception: Appears intact                POSTURE: No Significant postural limitations               PELVIC ALIGNMENT: Bilateral ASIS; sacrum rotated right   LUMBARAROM/PROM full lumbar ROM     LOWER EXTREMITY ROM:   Passive ROM Right eval Left eval  Hip external rotation  45 68   (Blank rows = not tested)   LOWER EXTREMITY MMT:   MMT Right eval Left eval  Hip extension 4/5       PALPATION:   General  Patient needs tactile cues to contract the abdominals without contracting the gluteals and raising the rib cage Tenderness located in the right gluteus medius, and into the right buttocks L5 rotated right                 External Perineal Exam intact                             Internal Pelvic Floor no tenderness   Patient confirms identification and approves PT to assess internal pelvic floor and treatment Yes   PELVIC MMT:   MMT eval  Vaginal 3/5 supine; standing 2/5  (Blank rows = not tested)         TONE: average   PROLAPSE: none   TODAY'S TREATMENT  11/24/2022  Neuromuscular re-education: Core retraining: Supine with towel roll under the pelvis for post. Pelvic tilt bringing legs up then legs down engaging the core and pelvic floor Supine dead bug with red band around knees and holding 2# wt. In hands Plank with core engaged correctly and bring shoulder blades down and back Abdominal curl up on incline with ball squeeze V up on ball    PATIENT  EDUCATION:  11/24/2022 Education details: Access Code: [email or web address] Person educated: Patient Education method: Explanation, handouts Education comprehension: verbalized understanding     HOME EXERCISE PROGRAM: 11/24/2022  Access Code: [email or web address] URL: [email or web address] Date: 11/24/2022 Prepared by: Earlie Counts  Program Notes incline abdominal curl  use the abdominals and breath out and put a ball between knees  Exercises - Dead Bug  - 1 x daily - 2 x weekly - 1 sets - 10 reps - Hooklying Sequential Leg March and Lower  - 1 x daily - 2 x weekly - 1 sets - 10 reps - Standard Plank  - 1 x daily - 2 x weekly - 1 sets - 2 reps - 15 sec hold   ASSESSMENT:   CLINICAL IMPRESSION: Patient is a 71 y.o. female who was seen today for physical therapy  treatment for Urge  incontinence of urine. Patient reports less leakage when she has the  urge and with sit ups. She now understands how to engage her upper and lower abdominals with core workout. She is using her breath to engage the lower abdominals more.  Patient will benefit from skilled therapy to improve pelvic floor strength to reduce her leakage with urge to void and abdominal sit ups.      OBJECTIVE IMPAIRMENTS decreased activity tolerance, decreased coordination, decreased endurance, and decreased strength.    ACTIVITY LIMITATIONS continence and walking in the door after being out.    PARTICIPATION LIMITATIONS: shopping   PERSONAL FACTORS 1-2 comorbidities: Dysplasia of cervix 05/2010; high risk of HPV infeciton  are also affecting patient's functional outcome.    REHAB POTENTIAL: Excellent   CLINICAL DECISION MAKING: Stable/uncomplicated   EVALUATION COMPLEXITY: Low     GOALS: Goals reviewed with patient? Yes   SHORT TERM GOALS: Target date: 10/27/2022   Patient independent with urge to void behavorial technique  Baseline: Goal status: Met 11/24/2022     LONG TERM GOALS: Target date: 12/22/2022    Patient independent with advanced HEP for pelvic floor and core to reduce urinary leakage.  Baseline:  Goal status: INITIAL   2.  Patient is able to walk in the door after being out and slowly walk to the bathroom without leaking urine.  Baseline:  Goal status: INITIAL   3.  Patient is able to perform sit ups with her workout without leaking urine and engaging her lower abdomen correctly.  Baseline:  Goal status: INITIAL   4.  Standing pelvic floor strength is >/= 3/5 to reduce her leakage while walking to the commode after coming home from the store.  Baseline:  Goal status: INITIAL     PLAN: PT FREQUENCY: 1x/week   PT DURATION: 12 weeks   PLANNED INTERVENTIONS: Therapeutic exercises, Therapeutic activity, Neuromuscular re-education, Patient/Family education, Self Care,  Biofeedback, and Manual therapy   PLAN FOR NEXT SESSION: work on pelvic floor strength in standing with what she is doing In gym, elevator, holding 15 sec, correct pelvis and sacrum  Earlie Counts, PT 11/24/22 5:10 PM

## 2022-12-15 ENCOUNTER — Ambulatory Visit: Payer: Medicare Other | Admitting: Physical Therapy

## 2022-12-27 ENCOUNTER — Encounter: Payer: Medicare Other | Admitting: Physical Therapy

## 2023-01-25 ENCOUNTER — Other Ambulatory Visit: Payer: Self-pay | Admitting: Nurse Practitioner

## 2023-01-25 NOTE — Telephone Encounter (Signed)
AEX 09/20/22

## 2023-01-25 NOTE — Telephone Encounter (Signed)
Patient called requesting #180 capsules at a time for convenience..  She said she used extra because she had outbreak  because she was traveling and left them home and went 4 days without and had outbreak.

## 2023-02-14 ENCOUNTER — Emergency Department (HOSPITAL_BASED_OUTPATIENT_CLINIC_OR_DEPARTMENT_OTHER): Payer: Medicare Other

## 2023-02-14 ENCOUNTER — Emergency Department (HOSPITAL_BASED_OUTPATIENT_CLINIC_OR_DEPARTMENT_OTHER)
Admission: EM | Admit: 2023-02-14 | Discharge: 2023-02-14 | Disposition: A | Discharge status: Home / Self Care | Payer: Medicare Other | Attending: Emergency Medicine | Admitting: Emergency Medicine

## 2023-02-14 ENCOUNTER — Other Ambulatory Visit: Payer: Self-pay

## 2023-02-14 DIAGNOSIS — Z79899 Other long term (current) drug therapy: Secondary | ICD-10-CM | POA: Diagnosis not present

## 2023-02-14 DIAGNOSIS — S0990XA Unspecified injury of head, initial encounter: Secondary | ICD-10-CM | POA: Insufficient documentation

## 2023-02-14 DIAGNOSIS — W228XXA Striking against or struck by other objects, initial encounter: Secondary | ICD-10-CM | POA: Diagnosis not present

## 2023-02-14 DIAGNOSIS — I10 Essential (primary) hypertension: Secondary | ICD-10-CM | POA: Insufficient documentation

## 2023-02-14 NOTE — ED Provider Notes (Signed)
[facility] Provider Note   CSN: [id number] Arrival date & time: 02/14/23  1807     History  Chief Complaint  Patient presents with   Head Injury    Rachel Shepherd is a 72 y.o. female.  HPI     This is a 72 year old female who presents with a head injury.  Patient reports that she was bending over when she stood up and hit her head on a counter.  No loss of consciousness.  She is not on any blood thinners.  She subsequently blew her nose and noted some blood on the tissue.  Of note she has had some nasal congestion and has had frequent nose blowing over the last 24 hours.  However this was the first time she noted blood and this concerned her.  Home Medications Prior to Admission medications   Medication Sig Start Date End Date Taking? Authorizing Provider  acyclovir (ZOVIRAX) 200 MG capsule TAKE 1 CAPSULE(200 MG) BY MOUTH TWICE DAILY 01/26/23   Marny Lowenstein A, NP  estradiol (ESTRACE) 0.5 MG tablet Take 0.5 tablets (0.25 mg total) by mouth daily. 11/08/22   Marny Lowenstein A, NP  MAGNESIUM PO Take 1 tablet by mouth daily.    [provider]  medroxyPROGESTERone (PROVERA) 5 MG tablet Take 1 tablet (5 mg total) by mouth daily. 11/08/22   Tamela Gammon, NP      Allergies    Amoxicillin, Clindamycin, and Doxycycline    Review of Systems   Review of Systems  HENT:  Positive for nosebleeds.   Neurological:  Positive for headaches.  All other systems reviewed and are negative.   Physical Exam Updated Vital Signs BP (!) 174/71 Comment: Simultaneous filing. User may not have seen previous data.  Pulse 82 Comment: Simultaneous filing. User may not have seen previous data.  Temp 98.7 F (37.1 C) (Oral)   Resp 18   Ht 1.6 m ('5\' 3"'$ )   Wt 53.1 kg   SpO2 98%   BMI 20.73 kg/m  Physical Exam Vitals and nursing note reviewed.  Constitutional:      Appearance: Normal appearance. She is well-developed. She is not  ill-appearing.  HENT:     Head: Normocephalic and atraumatic.     Comments: No hematomas or lacerations noted    Nose:     Comments: Friability noted over the left nasal septum, no septal hematoma Eyes:     Extraocular Movements: Extraocular movements intact.     Pupils: Pupils are equal, round, and reactive to light.  Cardiovascular:     Rate and Rhythm: Normal rate and regular rhythm.  Pulmonary:     Effort: Pulmonary effort is normal. No respiratory distress.     Breath sounds: No wheezing.  Abdominal:     Palpations: Abdomen is soft.  Musculoskeletal:     Cervical back: Neck supple.  Skin:    General: Skin is warm and dry.  Neurological:     General: No focal deficit present.     Mental Status: She is alert and oriented to person, place, and time.  Psychiatric:        Mood and Affect: Mood normal.     ED Results / Procedures / Treatments   Labs (all labs ordered are listed, but only abnormal results are displayed) Labs Reviewed - No data to display  EKG None  Radiology CT Head Wo Contrast  Result Date: 02/14/2023 CLINICAL DATA:  Hit the back of her head on  granite countertop EXAM: CT HEAD WITHOUT CONTRAST TECHNIQUE: Contiguous axial images were obtained from the base of the skull through the vertex without intravenous contrast. RADIATION DOSE REDUCTION: This exam was performed according to the departmental dose-optimization program which includes automated exposure control, adjustment of the mA and/or kV according to patient size and/or use of iterative reconstruction technique. COMPARISON:  None Available. FINDINGS: Brain: No evidence of acute infarction, hemorrhage, mass, mass effect, or midline shift. No hydrocephalus or extra-axial fluid collection. Vascular: No hyperdense vessel. Skull: Negative for fracture or focal lesion. Sinuses/Orbits: Mild mucosal thickening in the left sphenoid sinus. No acute finding in the orbits. Other: The mastoid air cells are well aerated.  IMPRESSION: No acute intracranial process. Electronically Signed   By: Merilyn Baba M.D.   On: 02/14/2023 19:20    Procedures Procedures    Medications Ordered in ED Medications - No data to display  ED Course/ Medical Decision Making/ A&P                             Medical Decision Making Amount and/or Complexity of Data Reviewed Radiology: ordered.   This patient presents to the ED for concern of head injury, this involves an extensive number of treatment options, and is a complaint that carries with it a high risk of complications and morbidity.  I considered the following differential and admission for this acute, potentially life threatening condition.  The differential diagnosis includes head injury, concussion, intracranial bleed  MDM:    This is a 72 year old female who presents with concern for head injury.  She reports she is very anxious.  Reports some epistaxis following bleed when blowing her nose however she has been blowing her nose most of the day.  CT head is negative for acute bleed.  She has some friability over the left nasal septum and I feel this is likely related to her frequent nose blowing earlier in the day and not related to head injury.  Discussed this with the patient.  She can start Flonase and nasal saline.  No active bleeding at this time.  (Labs, imaging, consults)  Labs: I Ordered, and personally interpreted labs.  The pertinent results include: None  Imaging Studies ordered: I ordered imaging studies including CT head I independently visualized and interpreted imaging. I agree with the radiologist interpretation  Additional history obtained from chart review.  External records from outside source obtained and reviewed including prior evaluations  Cardiac Monitoring: The patient was not maintained on a cardiac monitor.  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   I have reviewed the patients home medicines and have made adjustments as needed  Problem List / ED Course: Problem List Items Addressed This Visit   None  Visit Diagnoses     Minor head injury, initial encounter    -  Primary                   Final Clinical Impression(s) / ED Diagnoses Final diagnoses:  Minor head injury, initial encounter    Rx / DC Orders ED Discharge Orders     None         Merryl Hacker, MD 02/14/23 2341

## 2023-02-14 NOTE — ED Triage Notes (Signed)
Pt via pov from home with head injury today. She reports that she stood up and hit the back of her head on a granite countertop. Denies LOC but reports that she had blood in her tissue when she blew her nose. Pt concerned for possible bleed. Pt alert & oriented, anxious in triage.

## 2023-02-14 NOTE — Discharge Instructions (Addendum)
You were seen today for a head injury.  CT scan does not show any intracranial injury.  Take Tylenol or ibuprofen as needed for pain.

## 2023-03-02 ENCOUNTER — Telehealth: Payer: Self-pay

## 2023-03-02 DIAGNOSIS — N3941 Urge incontinence: Secondary | ICD-10-CM

## 2023-03-02 DIAGNOSIS — Z79890 Hormone replacement therapy (postmenopausal): Secondary | ICD-10-CM

## 2023-03-02 MED ORDER — ESTRADIOL 0.5 MG PO TABS: 0.2500 mg | ORAL_TABLET | Freq: Every day | ORAL | 2 refills | Status: DC

## 2023-03-02 MED ORDER — MEDROXYPROGESTERONE ACETATE 5 MG PO TABS: 5.0000 mg | ORAL_TABLET | Freq: Every day | ORAL | 2 refills | Status: DC

## 2023-03-02 NOTE — Telephone Encounter (Signed)
Patient called because she is not satisfied with [facility] and would like to have her HRT Rx's sent to local pharmacy.  Also she has not been to Rehab for pelvic PT since December and they tell her that they need another referral.

## 2023-03-02 NOTE — Telephone Encounter (Signed)
I sent her HRT Rx's to her local [facility] at her request.   I placed amb referral order for PT at [facility] for urge urinary incontinence. They will call her to schedule.  Spoke with patient and notified her.

## 2023-03-03 ENCOUNTER — Other Ambulatory Visit: Payer: Self-pay

## 2023-03-03 NOTE — Telephone Encounter (Signed)
Last AEX 09/20/2022--recall placed for 2024. Last mammo 05/13/2022-neg birads 1  Rx request for 10mg  provera received. However, pt now only taking 5mg  and rx was sent 03/02/2023. Will refuse this rx request.

## 2023-03-18 NOTE — Therapy (Unsigned)
OUTPATIENT PHYSICAL THERAPY FEMALE PELVIC EVALUATION   Patient Name: Rachel Shepherd MRN: [id number] DOB:January 29, 1951, 72 y.o., female Today's Date: 03/21/2023  END OF SESSION:  PT End of Session - 03/21/23 0849     Visit Number 1    Date for PT Re-Evaluation 06/13/23    Authorization Type Medicare    PT Start Time 0845    PT Stop Time 0925    PT Time Calculation (min) 40 min    Activity Tolerance Patient tolerated treatment well    Behavior During Therapy [facility] for tasks assessed/performed             Past Medical History:  Diagnosis Date   Atrophic vaginitis    Dysplasia of cervix, 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   HPV-HIGH RISK   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   HSV-2 (herpes simplex virus 2) infection    Hypertension    SUI (stress urinary incontinence, female)    Past Surgical History:  Procedure Laterality Date   COLPOSCOPY     CYST REMOVED RIGHT CHEEK     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  2009   TUBAL LIGATION     WRIST SURGERY     Patient Active Problem List   Diagnosis Date Noted   Nontraumatic tear of supraspinatus tendon, right 10/24/2019   Acute pain of right shoulder 10/13/2017   Acute pain of left shoulder 10/13/2017   Acute pain of right knee 04/29/2017   Impingement syndrome of right shoulder 04/29/2017   Hypertension    SUI (stress urinary incontinence, female)    Atrophic vaginitis    HSV-2 (herpes simplex virus 2) infection    Dysplasia of cervix, low grade (CIN 1)    High risk HPV infection    PAIN IN JOINT, SITE UNSPECIFIED 12/02/2010   SLEEP DISORDER 12/02/2010   MUSCLE PAIN 12/09/2009   POSTMENOPAUSAL STATUS 11/21/2009   CHEST DISCOMFORT 09/29/2009   COLONIC POLYPS, HX OF 07/09/2009   CHRONIC GRANULOMATOUS DISEASE 04/16/2008    PCP: Eartha Inch, MD  REFERRING PROVIDER: Olivia Mackie, NP   REFERRING DIAG: N39.41 (ICD-10-CM) - Urge urinary incontinence   THERAPY DIAG:  Other lack of coordination  Muscle weakness  (generalized)  Rationale for Evaluation and Treatment: Rehabilitation  ONSET DATE: 02/18/23  SUBJECTIVE:                                                                                                                                                                                           SUBJECTIVE STATEMENT: I have stopped my exercises and started to leak my urine.  Fluid intake: Yes: water    PAIN:  Are you having pain? No  PRECAUTIONS: None  WEIGHT BEARING RESTRICTIONS: No  FALLS:  Has patient fallen in last 6 months? No  LIVING ENVIRONMENT: Lives with: lives alone  OCCUPATION: retired  PLOF: Independent  PATIENT GOALS: reduce leakage  PERTINENT HISTORY:  Dysplasia of cervix, low grade (CIN 1)    BOWEL MOVEMENT: No issues  URINATION: Pain with urination: No Fully empty bladder: Yes:   Stream: Strong Urgency: Yes:   Frequency: average frequent Leakage: Urge to void, Walking to the bathroom, Coughing, Sneezing, Laughing, Exercise, and when leaning back to work the abdominals Pads: Yes: none  but trying to avoid using them  INTERCOURSE: not sexually active  PREGNANCY: none    OBJECTIVE:   DIAGNOSTIC FINDINGS:  none   COGNITION: Overall cognitive status: Within functional limits for tasks assessed     SENSATION: Light touch: Appears intact Proprioception: Appears intact   POSTURE: No Significant postural limitations, increased thoracic kyphosis, anterior pelvic tilt, and sway back  PELVIC ALIGNMENT:ASIS are equal  LUMBARAROM/PROM: lumbar ROM is full   LOWER EXTREMITY ROM: Bilateral hip ROM is full   LOWER EXTREMITY MMT:  MMT Right eval Left eval  Hip abduction 4/5 4/5   PALPATION:   General  supine lift both legs with arch her back and not engage the lower abdomen                External Perineal Exam good pink coloring                             Internal Pelvic Floor no tenderness  Patient confirms identification and approves  PT to assess internal pelvic floor and treatment Yes  PELVIC MMT:   MMT eval  Vaginal 4/5 holding 12 s 1x; 10 quick flicks but trouble relaxing in supine Standing strength is 3/5 with trouble engaging the lateral sides        TONE: average  PROLAPSE: none  TODAY'S TREATMENT:                                                                                                                              DATE: 03/21/23  EVAL see below   PATIENT EDUCATION:  03/21/23 Education details: Access Code: [email or web address] Person educated: Patient Education method: Programmer, multimediaxplanation, Demonstration, Tactile cues, Verbal cues, and Handouts Education comprehension: verbalized understanding, returned demonstration, verbal cues required, tactile cues required, and needs further education  HOME EXERCISE PROGRAM: 03/21/23 Access Code: [email or web address] URL: [email or web address] Date: 03/21/2023 Prepared by: Eulis Fosterheryl Greer Koeppen  Exercises - Standing March  - 1 x daily - 7 x weekly - 1 sets - 10 reps - Standing Side Lunge With Pelvic Floor Contraction  - 1 x daily - 7 x weekly - 1 sets - 10 reps - Supine Pelvic Tilt with Straight Leg Raise  - 1 x daily - 7 x weekly - 1 sets - 10 reps  ASSESSMENT:  CLINICAL IMPRESSION: Patient is a 72 y.o. female who was seen today for physical therapy evaluation and treatment for urge urinary incontinence. Patient has had increased in urinary leakage in the past few months. She did attend physical therapy but has stopped the exercises. She leaks urine with Urge to void, walking to the bathroom, coughing, sneezing, laughing, exercise, and when leaning back to work the abdominals. Pelvic floor strength is 4/5 in supine and 3/5 in standing. She has difficulty with engaging her lower abdominals with her pelvic floor contraction. She stands with increased in thoracic kyphosis and posterior pelvic tilt.  She is able to perform quick contractions but difficulty with relaxing. She has difficulty  with engaging her lower abdominals when lifting both legs off the mat due to weakness. Patient will benefit from skilled therapy to improve pelvic floor strength and coordination to reduce leakage.   OBJECTIVE IMPAIRMENTS: decreased activity tolerance, decreased coordination, decreased endurance, and decreased strength.   ACTIVITY LIMITATIONS: continence and exercise  PARTICIPATION LIMITATIONS: community activity  PERSONAL FACTORS: 1 comorbidity: Dysplasia of cervix, low grade (CIN 1)  are also affecting patient's functional outcome.   REHAB POTENTIAL: Excellent  CLINICAL DECISION MAKING: Stable/uncomplicated  EVALUATION COMPLEXITY: Low   GOALS: Goals reviewed with patient? Yes  SHORT TERM GOALS: Target date: 03/18/23  Patient independent with initial HEP for core and pelvic floor strength Baseline: Goal status: INITIAL  2.  Patient understands how to correct her posture to reduce her posterior pelvic tilt and increased in thoracic kyphosis.  Baseline:  Goal status: INITIAL    LONG TERM GOALS: Target date: 06/13/23  Patient independent with advanced HEP for core and pelvic floor strength to reduce urinary leakage.  Baseline:  Goal status: INITIAL  2.  She is able to engage her lower abdomen with pelvic floor contraction correctly to reduce her urinary leakage.  Baseline:  Goal status: INITIAL  3.  Patient is able to exercise with her trainer and at the gym without leaking urine due to correct pelvic floor contraction.  Baseline:  Goal status: INITIAL  4.  Patient is able to walk to the bathroom after getting the urge without leaking urine due to improved pelvic floor strength in standing.  Baseline:  Goal status: INITIAL  5.  Patient is able to cough or laugh without leaking urine due to the ability to engage her lower abdominals.  Baseline:  Goal status: INITIAL   PLAN:  PT FREQUENCY: 1x/week  PT DURATION: 12 weeks  PLANNED INTERVENTIONS: Therapeutic  exercises, Therapeutic activity, Neuromuscular re-education, Balance training, Patient/Family education, Joint mobilization, Dry Needling, Moist heat, Taping, Biofeedback, and Manual therapy  PLAN FOR NEXT SESSION: work on engaging lower abdominals with possible RUSI or pelvic floor EMG; work on diaphragm mobility and posture   Eulis FosterCheryl Telitha Plath, PT 03/21/23 12:04 PM

## 2023-03-21 ENCOUNTER — Ambulatory Visit: Payer: Medicare Other | Attending: Nurse Practitioner | Admitting: Physical Therapy

## 2023-03-21 ENCOUNTER — Encounter: Payer: Self-pay | Admitting: Physical Therapy

## 2023-03-21 ENCOUNTER — Other Ambulatory Visit: Payer: Self-pay

## 2023-03-21 DIAGNOSIS — N3941 Urge incontinence: Secondary | ICD-10-CM | POA: Diagnosis present

## 2023-03-21 DIAGNOSIS — R278 Other lack of coordination: Secondary | ICD-10-CM | POA: Diagnosis present

## 2023-03-21 DIAGNOSIS — M6281 Muscle weakness (generalized): Secondary | ICD-10-CM | POA: Insufficient documentation

## 2023-04-11 ENCOUNTER — Ambulatory Visit: Payer: Medicare Other | Admitting: Physical Therapy

## 2023-04-11 ENCOUNTER — Encounter: Payer: Self-pay | Admitting: Physical Therapy

## 2023-04-11 DIAGNOSIS — R278 Other lack of coordination: Secondary | ICD-10-CM

## 2023-04-11 DIAGNOSIS — M6281 Muscle weakness (generalized): Secondary | ICD-10-CM

## 2023-04-11 NOTE — Therapy (Signed)
OUTPATIENT PHYSICAL THERAPY TREATMENT NOTE   Patient Name: Rachel Shepherd MRN: [id number] DOB:1951/11/03, 72 y.o., female Today's Date: 04/11/2023  PCP:  Eartha Inch, MD  REFERRING PROVIDER: Olivia Mackie, NP   END OF SESSION:   PT End of Session - 04/11/23 1359     Visit Number 2    Date for PT Re-Evaluation 06/13/23    Authorization Type Medicare    Authorization - Visit Number 1    Authorization - Number of Visits 10    PT Start Time 1400    PT Stop Time 1440    PT Time Calculation (min) 40 min    Activity Tolerance Patient tolerated treatment well    Behavior During Therapy WFL for tasks assessed/performed             Past Medical History:  Diagnosis Date   Atrophic vaginitis    Dysplasia of cervix, 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   HPV-HIGH RISK   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   HSV-2 (herpes simplex virus 2) infection    Hypertension    SUI (stress urinary incontinence, female)    Past Surgical History:  Procedure Laterality Date   COLPOSCOPY     CYST REMOVED RIGHT CHEEK     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  2009   TUBAL LIGATION     WRIST SURGERY     Patient Active Problem List   Diagnosis Date Noted   Nontraumatic tear of supraspinatus tendon, right 10/24/2019   Acute pain of right shoulder 10/13/2017   Acute pain of left shoulder 10/13/2017   Acute pain of right knee 04/29/2017   Impingement syndrome of right shoulder 04/29/2017   Hypertension    SUI (stress urinary incontinence, female)    Atrophic vaginitis    HSV-2 (herpes simplex virus 2) infection    Dysplasia of cervix, low grade (CIN 1)    High risk HPV infection    PAIN IN JOINT, SITE UNSPECIFIED 12/02/2010   SLEEP DISORDER 12/02/2010   MUSCLE PAIN 12/09/2009   POSTMENOPAUSAL STATUS 11/21/2009   CHEST DISCOMFORT 09/29/2009   COLONIC POLYPS, HX OF 07/09/2009   CHRONIC GRANULOMATOUS DISEASE 04/16/2008   REFERRING DIAG: N39.41 (ICD-10-CM) - Urge urinary incontinence    THERAPY  DIAG:  Other lack of coordination   Muscle weakness (generalized)   Rationale for Evaluation and Treatment: Rehabilitation   ONSET DATE: 02/18/23   SUBJECTIVE:  SUBJECTIVE STATEMENT: I have some leakage still.      PAIN:  Are you having pain? No   PRECAUTIONS: None   WEIGHT BEARING RESTRICTIONS: No   FALLS:  Has patient fallen in last 6 months? No   LIVING ENVIRONMENT: Lives with: lives alone   OCCUPATION: retired   PLOF: Independent   PATIENT GOALS: reduce leakage   PERTINENT HISTORY:  Dysplasia of cervix, low grade (CIN 1)      BOWEL MOVEMENT: No issues   URINATION: Pain with urination: No Fully empty bladder: Yes:   Stream: Strong Urgency: Yes:   Frequency: average frequent Leakage: Urge to void, Walking to the bathroom, Coughing, Sneezing, Laughing, Exercise, and when leaning back to work the abdominals Pads: Yes: none  but trying to avoid using them   INTERCOURSE: not sexually active   PREGNANCY: none       OBJECTIVE:    DIAGNOSTIC FINDINGS:  none     COGNITION: Overall cognitive status: Within functional limits for tasks assessed                          SENSATION: Light touch: Appears intact Proprioception: Appears intact     POSTURE: No Significant postural limitations, increased thoracic kyphosis, anterior pelvic tilt, and sway back   PELVIC ALIGNMENT:ASIS are equal   LUMBARAROM/PROM: lumbar ROM is full     LOWER EXTREMITY ROM: Bilateral hip ROM is full     LOWER EXTREMITY MMT:   MMT Right eval Left eval  Hip abduction 4/5 4/5    PALPATION:   General  supine lift both legs with arch her back and not engage the lower abdomen                 External Perineal Exam good pink coloring                             Internal Pelvic Floor no  tenderness   Patient confirms identification and approves PT to assess internal pelvic floor and treatment Yes   PELVIC MMT:   MMT eval  Vaginal 4/5 holding 12 s 1x; 10 quick flicks but trouble relaxing in supine Standing strength is 3/5 with trouble engaging the lateral sides        TONE: average   PROLAPSE: none   TODAY'S TREATMENT:   04/11/23 Neuromuscular re-education: Pelvic floor contraction training: Pelvic floor EMG with surface electrodes on the vaginal area Supine quick flicks to 40 uv Supine holding for 10 sec above 10 uv to work on 50% contraction, when she fully contracts only hold for 1 sec at the level [employer] dog with pelvic floor contraction Plank with pelvic floor and lifting leg up Standing pelvic floor contraction holding 10 sec Standing and marching  Standing lunges  PATIENT EDUCATION:  04/11/23 Education details: Access Code: [email or web address] Person educated: Patient Education method: Explanation, Demonstration, Tactile cues, Verbal cues, and Handouts Education comprehension: verbalized understanding, returned demonstration, verbal cues required, tactile cues required, and needs further education   HOME EXERCISE PROGRAM: 04/11/23 Access Code: [email or web address] URL: [email or web address] Date: 04/11/2023 Prepared by: Eulis Foster  Exercises - Standing March  - 1 x daily - 7 x weekly - 1 sets - 10 reps - Standing Side Lunge With Pelvic Floor Contraction  - 1 x daily - 7 x weekly - 1 sets - 10 reps - Supine Pelvic Tilt with Straight Leg Raise  - 1 x daily - 7 x weekly - 1 sets - 10 reps - Dead Bug  - 1 x daily - 2 x weekly - 2 sets - 10 reps - Plank with Hip Extension  - 1 x daily - 2 x weekly - 2 sets - 10 reps - Standing Pelvic Floor Contraction  - 3 x daily - 7 x weekly - 1 sets - 5 reps - 10 sec hold   ASSESSMENT:    CLINICAL IMPRESSION: Patient is a 72 y.o. female who was seen today for physical therapy  treatment for urge urinary incontinence.  Patient resting tone is 2 uv. She is able to have good recruitment with quick flicks. She will fatigue when contracting the pelvic floor in supine and standing. She will do better if she contract 50% and hole the contraction longer. Patient will benefit from skilled therapy to improve pelvic floor strength and coordination to reduce leakage.    OBJECTIVE IMPAIRMENTS: decreased activity tolerance, decreased coordination, decreased endurance, and decreased strength.    ACTIVITY LIMITATIONS: continence and exercise   PARTICIPATION LIMITATIONS: community activity   PERSONAL FACTORS: 1 comorbidity: Dysplasia of cervix, low grade (CIN 1)  are also affecting patient's functional outcome.    REHAB POTENTIAL: Excellent   CLINICAL DECISION MAKING: Stable/uncomplicated   EVALUATION COMPLEXITY: Low     GOALS: Goals reviewed with patient? Yes   SHORT TERM GOALS: Target date: 03/18/23   Patient independent with initial HEP for core and pelvic floor strength Baseline: Goal status: Met 04/11/23   2.  Patient understands how to correct her posture to reduce her posterior pelvic tilt and increased in thoracic kyphosis.  Baseline:  Goal status: INITIAL       LONG TERM GOALS: Target date: 06/13/23   Patient independent with advanced HEP for core and pelvic floor strength to reduce urinary leakage.  Baseline:  Goal status: INITIAL   2.  She is able to engage her lower abdomen with pelvic floor contraction correctly to reduce her urinary leakage.  Baseline:  Goal status: INITIAL   3.  Patient is able to exercise with her trainer and at the gym without leaking urine due to correct pelvic floor contraction.  Baseline:  Goal status: INITIAL   4.  Patient is able to walk to the bathroom after getting the urge without leaking urine due to improved pelvic floor strength  in standing.  Baseline:  Goal status: INITIAL   5.  Patient is able to cough or laugh without leaking urine due to the ability to engage her lower abdominals.  Baseline:  Goal status: INITIAL     PLAN:   PT FREQUENCY: 1x/week   PT DURATION: 12 weeks   PLANNED INTERVENTIONS: Therapeutic exercises, Therapeutic activity, Neuromuscular re-education, Balance training, Patient/Family education, Joint mobilization, Dry Needling, Moist heat, Taping, Biofeedback, and Manual therapy   PLAN FOR  NEXT SESSION: work on engaging lower abdominals with possible RUSI or pelvic floor EMG; work on diaphragm mobility and posture, work on holding for 10 seconds without fatiguing   Eulis Foster, PT 04/11/23 2:43 PM

## 2023-04-13 ENCOUNTER — Other Ambulatory Visit: Payer: Self-pay

## 2023-04-13 DIAGNOSIS — B009 Herpesviral infection, unspecified: Secondary | ICD-10-CM

## 2023-04-13 MED ORDER — ACYCLOVIR 200 MG PO CAPS: ORAL_CAPSULE | ORAL | 1 refills | Status: DC

## 2023-04-13 NOTE — Telephone Encounter (Signed)
RF request received for Acyclovir 200mg . Last AEX 09/20/22.  RF sent.

## 2023-04-25 ENCOUNTER — Encounter: Payer: Self-pay | Admitting: Physical Therapy

## 2023-04-25 ENCOUNTER — Ambulatory Visit: Payer: Medicare Other | Admitting: Physical Therapy

## 2023-05-23 ENCOUNTER — Encounter: Payer: Self-pay | Admitting: Physical Therapy

## 2023-05-23 ENCOUNTER — Ambulatory Visit: Payer: Medicare Other | Attending: Nurse Practitioner | Admitting: Physical Therapy

## 2023-05-23 DIAGNOSIS — R278 Other lack of coordination: Secondary | ICD-10-CM | POA: Diagnosis present

## 2023-05-23 DIAGNOSIS — M6281 Muscle weakness (generalized): Secondary | ICD-10-CM | POA: Insufficient documentation

## 2023-05-23 NOTE — Therapy (Signed)
OUTPATIENT PHYSICAL THERAPY TREATMENT NOTE   Patient Name: Rachel Shepherd MRN: [id number] DOB:03-25-1951, 72 y.o., female Today's Date: 05/23/2023  PCP:  Eartha Inch, MD  REFERRING PROVIDER: Olivia Mackie, NP   END OF SESSION:   PT End of Session - 05/23/23 1231     Visit Number 3    Date for PT Re-Evaluation 06/13/23    Authorization Type Medicare    Authorization - Visit Number 2    Authorization - Number of Visits 10    PT Start Time 1230    PT Stop Time 1310    PT Time Calculation (min) 40 min    Activity Tolerance Patient tolerated treatment well    Behavior During Therapy WFL for tasks assessed/performed             Past Medical History:  Diagnosis Date   Atrophic vaginitis    Dysplasia of cervix, 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   HPV-HIGH RISK   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   HSV-2 (herpes simplex virus 2) infection    Hypertension    SUI (stress urinary incontinence, female)    Past Surgical History:  Procedure Laterality Date   COLPOSCOPY     CYST REMOVED RIGHT CHEEK     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  2009   TUBAL LIGATION     WRIST SURGERY     Patient Active Problem List   Diagnosis Date Noted   Nontraumatic tear of supraspinatus tendon, right 10/24/2019   Acute pain of right shoulder 10/13/2017   Acute pain of left shoulder 10/13/2017   Acute pain of right knee 04/29/2017   Impingement syndrome of right shoulder 04/29/2017   Hypertension    SUI (stress urinary incontinence, female)    Atrophic vaginitis    HSV-2 (herpes simplex virus 2) infection    Dysplasia of cervix, low grade (CIN 1)    High risk HPV infection    PAIN IN JOINT, SITE UNSPECIFIED 12/02/2010   SLEEP DISORDER 12/02/2010   MUSCLE PAIN 12/09/2009   POSTMENOPAUSAL STATUS 11/21/2009   CHEST DISCOMFORT 09/29/2009   COLONIC POLYPS, HX OF 07/09/2009   CHRONIC GRANULOMATOUS DISEASE 04/16/2008   REFERRING DIAG: N39.41 (ICD-10-CM) - Urge urinary incontinence    THERAPY  DIAG:  Other lack of coordination   Muscle weakness (generalized)   Rationale for Evaluation and Treatment: Rehabilitation   ONSET DATE: 02/18/23   SUBJECTIVE:  SUBJECTIVE STATEMENT: I have been feeling good. I have not had issues.     PAIN:  Are you having pain? No   PRECAUTIONS: None   WEIGHT BEARING RESTRICTIONS: No   FALLS:  Has patient fallen in last 6 months? No   LIVING ENVIRONMENT: Lives with: lives alone   OCCUPATION: retired   PLOF: Independent   PATIENT GOALS: reduce leakage   PERTINENT HISTORY:  Dysplasia of cervix, low grade (CIN 1)      BOWEL MOVEMENT: No issues   URINATION: Pain with urination: No Fully empty bladder: Yes:   Stream: Strong Urgency: Yes:   Frequency: average frequent Leakage: Urge to void, Walking to the bathroom, Coughing, Sneezing, Laughing, Exercise, and when leaning back to work the abdominals Pads: Yes: none  but trying to avoid using them   INTERCOURSE: not sexually active   PREGNANCY: none       OBJECTIVE:    DIAGNOSTIC FINDINGS:  none     COGNITION: Overall cognitive status: Within functional limits for tasks assessed                          SENSATION: Light touch: Appears intact Proprioception: Appears intact     POSTURE: No Significant postural limitations, increased thoracic kyphosis, anterior pelvic tilt, and sway back   PELVIC ALIGNMENT:ASIS are equal   LUMBARAROM/PROM: lumbar ROM is full     LOWER EXTREMITY ROM: Bilateral hip ROM is full     LOWER EXTREMITY MMT:   MMT Right eval Left eval  Hip abduction 4/5 4/5    PALPATION:   General  supine lift both legs with arch her back and not engage the lower abdomen                 External Perineal Exam good pink coloring                             Internal  Pelvic Floor no tenderness   Patient confirms identification and approves PT to assess internal pelvic floor and treatment Yes   PELVIC MMT:   MMT eval 6/10 24  Vaginal 4/5 holding 12 s 1x; 10 quick flicks but trouble relaxing in supine Standing strength is 3/5 with trouble engaging the lateral sides 4/5 holding 10 seconds with circular contraction and 10 quick flicks supine        TONE: average   PROLAPSE: none   TODAY'S TREATMENT:   05/23/23 Manual: Internal pelvic floor techniques: No emotional/communication barriers or cognitive limitation. Patient is motivated to learn. Patient understands and agrees with treatment goals and plan. PT explains patient will be examined in standing, sitting, and lying down to see how their muscles and joints work. When they are ready, they will be asked to remove their underwear so PT can examine their perineum. The patient is also given the option of providing their own chaperone as one is not provided in our facility. The patient also has the right and is explained the right to defer or refuse any part of the evaluation or treatment including the internal exam. With the patient's consent, PT will use one gloved finger to gently assess the muscles of the pelvic floor, seeing how well it contracts and relaxes and if there is muscle symmetry. After, the patient will get dressed and PT and patient will discuss exam findings and plan of care. PT and patient discuss plan of care,  schedule, attendance policy and HEP activities.  Going through the vaginal canal working on pelvic floor contraction and quick flicks Exercises: Strengthening: tactile cues to engage her lower abdomen correctly Plank with pelvic floor and lifting leg up 10 x Bird dog with pelvic floor contraction 10 x Side plank with moving legs 10 x  Discussed with patient to use her core correctly with her workouts with the trainer to strengthen the pelvic floor    04/11/23 Neuromuscular  re-education: Pelvic floor contraction training: Pelvic floor EMG with surface electrodes on the vaginal area Supine quick flicks to 40 uv Supine holding for 10 sec above 10 uv to work on 50% contraction, when she fully contracts only hold for 1 sec at the level [employer] dog with pelvic floor contraction Plank with pelvic floor and lifting leg up Standing pelvic floor contraction holding 10 sec Standing and marching  Standing lunges                                                                                                                                   PATIENT EDUCATION:  05/23/23 Education details: Access Code: [email or web address] Person educated: Patient Education method: Explanation, Demonstration, Tactile cues, Verbal cues, and Handouts Education comprehension: verbalized understanding, returned demonstration, verbal cues required, tactile cues required, and needs further education   HOME EXERCISE PROGRAM: 05/23/23 Access Code: [email or web address] URL: [email or web address] Date: 04/11/2023 Prepared by: Eulis Foster  Exercises - Standing March  - 1 x daily - 7 x weekly - 1 sets - 10 reps - Standing Side Lunge With Pelvic Floor Contraction  - 1 x daily - 7 x weekly - 1 sets - 10 reps - Supine Pelvic Tilt with Straight Leg Raise  - 1 x daily - 7 x weekly - 1 sets - 10 reps - Dead Bug  - 1 x daily - 2 x weekly - 2 sets - 10 reps - Plank with Hip Extension  - 1 x daily - 2 x weekly - 2 sets - 10 reps - Standing Pelvic Floor Contraction  - 3 x daily - 7 x weekly - 1 sets - 5 reps - 10 sec hold   ASSESSMENT:   CLINICAL IMPRESSION: Patient is a 72 y.o. female who was seen today for physical therapy  treatment for urge urinary incontinence.  Patient has improved pelvic floor strength. She has only leaked 2 times that were small walking to the bathroom. Patient is back to her exercise routine that is helping with her pelvic floor strength. Patient was able to do an inverted curl up without  leaking urine now.    OBJECTIVE IMPAIRMENTS: decreased activity tolerance, decreased coordination, decreased endurance, and decreased strength.    ACTIVITY LIMITATIONS: continence and exercise   PARTICIPATION LIMITATIONS: community activity   PERSONAL FACTORS: 1 comorbidity: Dysplasia of cervix, low grade (CIN 1)  are also affecting patient's functional outcome.    REHAB POTENTIAL: Excellent   CLINICAL  DECISION MAKING: Stable/uncomplicated   EVALUATION COMPLEXITY: Low     GOALS: Goals reviewed with patient? Yes   SHORT TERM GOALS: Target date: 03/18/23   Patient independent with initial HEP for core and pelvic floor strength Baseline: Goal status: Met 04/11/23   2.  Patient understands how to correct her posture to reduce her posterior pelvic tilt and increased in thoracic kyphosis.  Baseline:  Goal status: Met 05/23/23       LONG TERM GOALS: Target date: 06/13/23   Patient independent with advanced HEP for core and pelvic floor strength to reduce urinary leakage.  Baseline:  Goal status: Met 05/23/23   2.  She is able to engage her lower abdomen with pelvic floor contraction correctly to reduce her urinary leakage.  Baseline:  Goal status: Met 05/23/23   3.  Patient is able to exercise with her trainer and at the gym without leaking urine due to correct pelvic floor contraction.  Baseline:  Goal status: Met 05/23/23   4.  Patient is able to walk to the bathroom after getting the urge without leaking urine due to improved pelvic floor strength in standing.  Baseline:  Goal status: Partially met 05/23/23   5.  Patient is able to cough or laugh without leaking urine due to the ability to engage her lower abdominals.  Baseline:  Goal status: Met 05/23/23     PLAN: Discharge from PT today     Eulis Foster, PT 05/23/23 1:05 PM  PHYSICAL THERAPY DISCHARGE SUMMARY  Visits from Start of Care: 3  Current functional level related to goals / functional outcomes: See above.     Remaining deficits: See above.    Education / Equipment: HEP   Patient agrees to discharge. Patient goals were met. Patient is being discharged due to meeting the stated rehab goals. Thank you for the referral. Eulis Foster, PT 05/23/23 1:05 PM

## 2023-06-06 ENCOUNTER — Encounter: Payer: Medicare Other | Admitting: Physical Therapy

## 2023-06-20 ENCOUNTER — Encounter: Payer: Medicare Other | Admitting: Physical Therapy

## 2023-07-18 ENCOUNTER — Encounter: Payer: Self-pay | Admitting: Nurse Practitioner

## 2023-07-19 NOTE — Telephone Encounter (Signed)
Will leave encounter open to document when pt gets scheduled.

## 2023-07-20 ENCOUNTER — Telehealth: Payer: Self-pay

## 2023-07-20 NOTE — Telephone Encounter (Signed)
Pt scheduled for AEX on 09/21/2023. Routing to provider for final review and closing.

## 2023-07-20 NOTE — Telephone Encounter (Signed)
Pt LVM in triage line stating that she needed to schedule her AEX and needed refill on medication.   Staff msg and mychart msg dated 07/18/23 sent to appt desk to schedule.

## 2023-07-20 NOTE — Telephone Encounter (Signed)
Pt now scheduled for appt w/ TW for B&P on 09/21/2023.   Called pt to confirm which medication it was that she needed refills on because I thought that I had heard on her VM that she needed a refill on her prozac? Pt reported that she does but that msg was meant for another office and asked for [place] to disregard. Will route to provider for final review and close.

## 2023-07-21 ENCOUNTER — Encounter: Payer: Self-pay | Admitting: Nurse Practitioner

## 2023-08-24 ENCOUNTER — Ambulatory Visit: Payer: Medicare Other | Admitting: Nurse Practitioner

## 2023-09-21 ENCOUNTER — Ambulatory Visit (INDEPENDENT_AMBULATORY_CARE_PROVIDER_SITE_OTHER): Payer: Medicare Other | Admitting: Nurse Practitioner

## 2023-09-21 ENCOUNTER — Encounter: Payer: Self-pay | Admitting: Nurse Practitioner

## 2023-09-21 VITALS — BP 124/70 | HR 77 | Ht 61.75 in | Wt 123.0 lb

## 2023-09-21 DIAGNOSIS — Z79890 Hormone replacement therapy (postmenopausal): Secondary | ICD-10-CM

## 2023-09-21 DIAGNOSIS — Z9189 Other specified personal risk factors, not elsewhere classified: Secondary | ICD-10-CM | POA: Diagnosis not present

## 2023-09-21 DIAGNOSIS — Z01419 Encounter for gynecological examination (general) (routine) without abnormal findings: Secondary | ICD-10-CM

## 2023-09-21 DIAGNOSIS — B009 Herpesviral infection, unspecified: Secondary | ICD-10-CM | POA: Diagnosis not present

## 2023-09-21 MED ORDER — ESTRADIOL 0.5 MG PO TABS
0.2500 mg | ORAL_TABLET | Freq: Every day | ORAL | 1 refills | Status: DC
Start: 2023-09-21 — End: 2024-09-05

## 2023-09-21 MED ORDER — ACYCLOVIR 200 MG PO CAPS
ORAL_CAPSULE | ORAL | 3 refills | Status: DC
Start: 2023-09-21 — End: 2024-02-06

## 2023-09-21 MED ORDER — MEDROXYPROGESTERONE ACETATE 5 MG PO TABS
5.0000 mg | ORAL_TABLET | Freq: Every day | ORAL | 3 refills | Status: DC
Start: 2023-09-21 — End: 2024-09-21

## 2023-09-21 NOTE — Progress Notes (Signed)
Rachel Shepherd 06-13-1951 [id number]   History:  72 y.o. G0 presents for breast and pelvic exam. Postmenopausal - on HRT for hot flashes and night sweats. Taking 0.25 mg Estradiol daily, provera 5 mg daily. Has tried to wean but does not tolerate. 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 HSV-1, takes daily, occasional oral outbreaks. Very active with yoga, pickle ball, and has a Systems analyst.   Gynecologic History No LMP recorded. Patient is postmenopausal.   Contraception: post menopausal status Sexually active: Yes  Health Maintenance Last Pap: 07/22/2020. Results were: Normal neg HPV Last mammogram: 07/19/2023. Results were: Normal Last colonoscopy: 08/05/2020, 5-year recall Last Dexa: 07/13/2022. Results were: Normal  Past medical history, past surgical history, family history and social history were all reviewed and documented in the EPIC chart. Widowed. Husband passed from liver cancer a couple of years ago. Has toy poodle.   ROS:  A ROS was performed and pertinent positives and negatives are included.  Exam:  Vitals:   09/21/23 1208  BP: 124/70  Pulse: 77  SpO2: 98%  Weight: 123 lb (55.8 kg)  Height: 5' 1.75" (1.568 m)    Body mass index is 22.68 kg/m.  General appearance:  Normal Thyroid:  Symmetrical, normal in size, without palpable masses or nodularity. Respiratory  Auscultation:  Clear without wheezing or rhonchi Cardiovascular  Auscultation:  Regular rate, without rubs, murmurs or gallops  Edema/varicosities:  Not grossly evident Abdominal  Soft,nontender, without masses, guarding or rebound.  Liver/spleen:  No organomegaly noted  Hernia:  None appreciated  Skin  Inspection:  Grossly normal Breasts: Examined lying and sitting.   Right: Without masses, retractions, nipple discharge or axillary adenopathy.   Left: Without masses, retractions, nipple discharge or axillary adenopathy. Pelvic: External genitalia:  no lesions              Urethra:  normal  appearing urethra with no masses, tenderness or lesions              Bartholins and Skenes: normal                 Vagina: normal appearing vagina with normal color and discharge, no lesions              Cervix: no lesions Bimanual Exam:  Uterus:  no masses or tenderness              Adnexa: no mass, fullness, tenderness              Rectovaginal: Deferred              Anus:  normal, no lesions   Patient informed chaperone available to be present for breast and pelvic exam. Patient has requested no chaperone to be present. Patient has been advised what will be completed during breast and pelvic exam.   Assessment/Plan:  72 y.o. G0 for breast and pelvic exam.   Encounter for breast and pelvic examination - Education provided on SBEs, importance of preventative screenings, current guidelines, high calcium diet, regular exercise, and multivitamin daily. Labs with PCP.   Postmenopausal hormone therapy - Plan: estradiol (ESTRACE) 0.5 MG tablet (0.25 mg, 1/2 tablet) daily, medroxyPROGESTERone (PROVERA) 5 MG tablet daily. Has tried to wean in the past but does not tolerate. Refill x 1 year provided.   HSV infection - Plan: acyclovir (ZOVIRAX) 200 MG capsule BID. Recommend decreasing to daily due to age. Can increase back if she experiences outbreaks. Agreeable.   Screening for cervical cancer - 2011 CIN-1,  subsequent paps normal. No longer screening per guidelines.   Screening for breast cancer - Normal mammogram history.  Continue annual screenings.  Normal breast exam today.  Screening for colon cancer - 2021 colonoscopy. Will repeat at 5-year interval per GI's recommendation.   Screening for osteoporosis - Normal bone density in August 2023.    Return in about 1 year (around 09/20/2024) for B&P.     Olivia Mackie DNP, 12:33 PM 09/21/2023

## 2023-09-22 ENCOUNTER — Encounter: Payer: Self-pay | Admitting: Nurse Practitioner

## 2023-09-29 ENCOUNTER — Encounter: Payer: Self-pay | Admitting: Gastroenterology

## 2023-12-18 ENCOUNTER — Encounter: Payer: Self-pay | Admitting: Nurse Practitioner

## 2023-12-27 ENCOUNTER — Encounter: Payer: Self-pay | Admitting: Gastroenterology

## 2024-01-02 ENCOUNTER — Encounter: Payer: Self-pay | Admitting: Nurse Practitioner

## 2024-01-04 ENCOUNTER — Encounter: Payer: Self-pay | Admitting: Nurse Practitioner

## 2024-01-10 ENCOUNTER — Ambulatory Visit: Payer: Medicare Other | Admitting: Gastroenterology

## 2024-02-03 ENCOUNTER — Other Ambulatory Visit: Payer: Self-pay | Admitting: Nurse Practitioner

## 2024-02-03 DIAGNOSIS — B009 Herpesviral infection, unspecified: Secondary | ICD-10-CM

## 2024-02-06 NOTE — Telephone Encounter (Signed)
 Med refill request: Acyclovir Last AEX: 09/21/2023-TW Next AEX: nothing now, recall placed for 2025 Last MMG (if hormonal med): n/a Refill authorized: rx pend.   Rx sent for #180 w/ 3 refills in 09/2023 to alternate pharmacy.

## 2024-02-06 NOTE — Telephone Encounter (Signed)
 Spoke w/ the pt and she said that she is now having to send all rxs to [facility] instead of [facility] due to pricing/insurance.   Pt request new rx be sent.   Rx pend.

## 2024-02-07 ENCOUNTER — Encounter: Payer: Self-pay | Admitting: Gastroenterology

## 2024-02-07 ENCOUNTER — Ambulatory Visit (INDEPENDENT_AMBULATORY_CARE_PROVIDER_SITE_OTHER): Payer: Medicare Other | Admitting: Gastroenterology

## 2024-02-07 VITALS — BP 150/68 | HR 78 | Ht 62.0 in | Wt 126.0 lb

## 2024-02-07 DIAGNOSIS — K649 Unspecified hemorrhoids: Secondary | ICD-10-CM | POA: Diagnosis not present

## 2024-02-07 DIAGNOSIS — Z860101 Personal history of adenomatous and serrated colon polyps: Secondary | ICD-10-CM

## 2024-02-07 DIAGNOSIS — R030 Elevated blood-pressure reading, without diagnosis of hypertension: Secondary | ICD-10-CM | POA: Insufficient documentation

## 2024-02-07 NOTE — Patient Instructions (Signed)
 You will be due for a recall colonoscopy in 12/2024. We will send you a reminder in the mail when it gets closer to that time.   Call office or send my chart with any issues that may occur prior to your recall.   _______________________________________________________  If your blood pressure at your visit was 140/90 or greater, please contact your primary care physician to follow up on this.  _______________________________________________________  If you are age 67 or older, your body mass index should be between 23-30. Your Body mass index is 23.05 kg/m. If this is out of the aforementioned range listed, please consider follow up with your Primary Care Provider.  If you are age 40 or younger, your body mass index should be between 19-25. Your Body mass index is 23.05 kg/m. If this is out of the aformentioned range listed, please consider follow up with your Primary Care Provider.   ________________________________________________________  The [facility] providers would like to encourage you to use [facility] to communicate with providers for non-urgent requests or questions.  Due to long hold times on the telephone, sending your provider a message by [facility] may be a faster and more efficient way to get a response.  Please allow 48 business hours for a response.  Please remember that this is for non-urgent requests.  _______________________________________________________  Thank you for choosing me and [facility].  Dr. Meridee Score

## 2024-02-07 NOTE — Progress Notes (Signed)
 GASTROENTEROLOGY OUTPATIENT CLINIC VISIT   Primary Care Provider Eartha Inch, MD [address] [place] [address] [phone]  Referring Provider Eartha Inch, MD [address] [place],  [place] [address] [phone]  Patient Profile: Rachel Shepherd is a 73 y.o. female with a pmh significant for anxiety, MDD, colon polyps (TA's), hemorrhoids (status post prior banding's).  The patient presents to the [facility] for an evaluation and management of problem(s) noted below:  Problem List 1. Hx of adenomatous colonic polyps   2. Hemorrhoids, unspecified hemorrhoid type   3. Elevated blood pressure reading    Discussed the use of AI scribe software for clinical note transcription with the patient, who gave verbal consent to proceed.  History of Present Illness This is the patient's first visit to the [facility] GI clinic and here to establish GI care.  She has previously been followed by Dr. Kinnie Scales for years, but he has since retired.  She has a history of previous TAs on prior colonoscopy, but her last colonoscopy in 2021 was reported normal (per telephone notation but I could not see the actual report).  She has dealt with hemorrhoids in the past and required banding procedures on 2 separate episodes in the last few years.  She is currently doing quite well.  No abdominal pain, discomfort, or changes in bowel habits.  She experiences significant gas after consuming canned beets but otherwise denies regular issues with gas or bloating.  She maintains regular bowel movements, typically once or twice daily, which are well-formed. Her diet includes cereal, yogurt, and blueberries, which she believes help keep her regular. No difficulty swallowing or new heartburn symptoms.  She reports recent high blood pressure readings including at her PCP and is not on any BP medications.  She has follow-up in the next week with them.   GI Review  of Systems Positive as above Negative for pyrosis, dysphagia, odynophagia, nausea, vomiting, pain, alteration of bowel habits, melena, hematochezia  Review of Systems General: Denies fevers/chills/weight loss unintentionally cardiovascular: Denies chest pain/palpitations Pulmonary: Denies shortness of breath/cough Gastroenterological: See HPI Genitourinary: Denies darkened urine Hematological: Denies easy bruising/bleeding Dermatological: Denies jaundice Psychological: Mood is stable, but hopeful for further improvement with retitration of her medication   Medications Current Outpatient Medications  Medication Sig Dispense Refill   acyclovir (ZOVIRAX) 200 MG capsule TAKE 1 CAPSULE(200 MG) BY MOUTH TWICE DAILY 180 capsule 1   estradiol (ESTRACE) 0.5 MG tablet Take 0.5 tablets (0.25 mg total) by mouth daily. 90 tablet 1   FLUoxetine (PROZAC) 10 MG capsule Take by mouth.     fluticasone (FLONASE) 50 MCG/ACT nasal spray INHALE 1-2 SPRAYS IN EACH NOSTRIL DAILY     MAGNESIUM PO Take 1 tablet by mouth daily.     medroxyPROGESTERone (PROVERA) 5 MG tablet Take 1 tablet (5 mg total) by mouth daily. 90 tablet 3   No current facility-administered medications for this visit.    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   HSV-2 (herpes simplex virus  2) infection    Hypertension    SUI (stress urinary incontinence, female)    Past Surgical History:  Procedure Laterality Date   COLPOSCOPY     CYST REMOVED RIGHT CHEEK     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  2009   TUBAL LIGATION     WRIST SURGERY     Social History    Socioeconomic History   Marital status: Widowed    Spouse name: Not on file   Number of children: 0   Years of education: Not on file   Highest education level: Not on file  Occupational History   Occupation: retired  Tobacco Use   Smoking status: Never   Smokeless tobacco: Never  Vaping Use   Vaping status: Never Used  Substance and Sexual Activity   Alcohol use: Not Currently    Alcohol/week: 5.0 standard drinks of alcohol    Types: 5 Standard drinks or equivalent per week    Comment: occ   Drug use: No   Sexual activity: Yes    Birth control/protection: Post-menopausal, Surgical    Comment: First IC >14y/o, 5 Partners  Other Topics Concern   Not on file  Social History Narrative   Not on file   Social Drivers of Health   Financial Resource Strain: Low Risk  (12/26/2023)   Received from [facility]   Overall Financial Resource Strain (CARDIA)    Difficulty of Paying Living Expenses: Not hard at all  Food Insecurity: No Food Insecurity (12/26/2023)   Received from [facility]   Hunger Vital Sign    Worried About Running Out of Food in the Last Year: Never true    Ran Out of Food in the Last Year: Never true  Transportation Needs: No Transportation Needs (12/26/2023)   Received from [facility] - Transportation    Lack of Transportation (Medical): No    Lack of Transportation (Non-Medical): No  Physical Activity: Unknown (06/19/2023)   Received from [facility]   Exercise Vital Sign    Days of Exercise per Week: 3 days    Minutes of Exercise per Session: Patient declined  Stress: No Stress Concern Present (06/19/2023)   Received from [facility] - Occupational Stress Questionnaire    Feeling of Stress : Only a little  Social Connections: Socially Integrated (06/19/2023)   Received from [facility]   Social Network    How would you rate your social network (family, work, friends)?: Good participation  with social networks  Intimate Partner Violence: Not At Risk (06/19/2023)   Received from [facility]   HITS    Over the last 12 months how often did your partner physically hurt you?: Never    Over the last 12 months how often did your partner insult you or talk down to you?: Sometimes    Over the last 12 months how often did your partner threaten you with physical harm?: Never    Over the last 12 months how often did your partner scream or curse at you?: Never   Family History  Problem Relation Age of Onset   Heart disease Mother        PACE MAKER   Breast cancer Mother        Age 31   Stroke Paternal Grandmother    Heart disease Paternal Grandmother    Liver disease Neg Hx    Colon cancer Neg Hx    Esophageal cancer Neg Hx  Inflammatory bowel disease Neg Hx    Pancreatic cancer Neg Hx    Rectal cancer Neg Hx    Stomach cancer Neg Hx    I have reviewed her medical, social, and family history in detail and updated the electronic medical record as necessary.    PHYSICAL EXAMINATION  BP (!) 150/68   Pulse 78   Ht 5\' 2"  (1.575 m)   Wt 126 lb (57.2 kg)   BMI 23.05 kg/m  Wt Readings from Last 3 Encounters:  02/07/24 126 lb (57.2 kg)  09/21/23 123 lb (55.8 kg)  02/14/23 117 lb (53.1 kg)  GEN: NAD, appears stated age, doesn't appear chronically ill PSYCH: Cooperative, without pressured speech EYE: Conjunctivae pink, sclerae anicteric ENT: MMM CV: Nontachycardic RESP: No audible wheezing GI: NABS, soft, NT/ND, without rebound or guarding, no HSM appreciated MSK/EXT: No significant lower extremity edema SKIN: No jaundice NEURO:  Alert & Oriented x 3, no focal deficits   REVIEW OF DATA  I reviewed the following data at the time of this encounter:  GI Procedures and Studies  Reported colonoscopy 2021 but I do not have access to that record other than it says it was normal with a 5-year recommendation  2015 colonoscopy outside facility No colorectal neoplasia Normal  ileoscopy Repeat colonoscopy in 5 years  2009 colonoscopy Right colon polyp diminutive, hot biopsy resection. Internal hemorrhoids grade 3 Repeat colonoscopy in 5 years Pathology consistent with tubular adenoma  2004 colonoscopy normal colonoscopy Internal hemorrhoids grade 2  Laboratory Studies  Reviewed those in epic  Imaging Studies  No relevant studies to review   ASSESSMENT  Ms. Brau is a 73 y.o. female with a pmh significant for anxiety, MDD, colon polyps (TA's), hemorrhoids (status post prior banding's).  The patient is seen today for evaluation and management of:  1. Hx of adenomatous colonic polyps   2. Hemorrhoids, unspecified hemorrhoid type   3. Elevated blood pressure reading    The patient is hemodynamically and clinically stable at this time.  She will be due based on previous GI MD recommendations for 5-year follow-up in 2026 for colonoscopy.  At that point we will bring her up-to-date to the new guidelines surveillance protocol.  She is not dealing with any hemorrhoids at this time but has in the past hopefully she will continue to do well.  She has a good dietary fiber supplementation.  No other significant GI symptomatology at this time.  She is aware to reach out to [place] via phone or MyChart if she has other issues between now and her colonoscopy which will be placed in for recall in 2026.  Will be happy to see her sooner if needed.  She has follow-up from a blood pressure standpoint with her PCP in the next few weeks.  Hopefully this is just a result of issues dealing with anxiety but if not medication therapy may need to be considered.  All patient questions were answered to the best of my ability, and the patient agrees to the aforementioned plan of action with follow-up as indicated.   PLAN  Colonoscopy recall 2026 Blood pressure follow-up with PCP in the next couple of weeks as already scheduled Follow-up as needed or sooner if GI issues develop   No  orders of the defined types were placed in this encounter.   New Prescriptions   No medications on file   Modified Medications   No medications on file    Planned Follow Up No follow-ups on file.  Total Time in Face-to-Face and in Coordination of Care for patient including independent/personal interpretation/review of prior testing, medical history, examination, medication adjustment, communicating results with the patient directly, and documentation within the EHR is 30 minutes.   Corliss Parish, MD [facility] Office # [phone] Dysphagia, odynophagia, pyrosis, nausea, vomiting, pain

## 2024-09-05 ENCOUNTER — Other Ambulatory Visit: Payer: Self-pay

## 2024-09-05 DIAGNOSIS — Z79890 Hormone replacement therapy (postmenopausal): Secondary | ICD-10-CM

## 2024-09-05 MED ORDER — ESTRADIOL 0.5 MG PO TABS
0.2500 mg | ORAL_TABLET | Freq: Every day | ORAL | 0 refills | Status: DC
Start: 2024-09-05 — End: 2024-09-21

## 2024-09-05 NOTE — Telephone Encounter (Signed)
 Med refill request:   estradiol  (ESTRACE ) 0.5 MG tablet  Start Date: 09/21/23 Dispense Quantity: 90 tablet (180 day supply) with 1 refill - Take 0.5 tablets (0.25 mg total) by mouth daily.  Last AEX:  05/22/24 Next AEX:  09/21/24 Last MMG (if hormonal med):  07/19/23 Refill authorized? Please Advise.

## 2024-09-21 ENCOUNTER — Encounter: Payer: Self-pay | Admitting: Nurse Practitioner

## 2024-09-21 ENCOUNTER — Ambulatory Visit: Admitting: Nurse Practitioner

## 2024-09-21 VITALS — BP 130/60 | HR 70 | Ht 61.61 in | Wt 121.6 lb

## 2024-09-21 DIAGNOSIS — Z9189 Other specified personal risk factors, not elsewhere classified: Secondary | ICD-10-CM

## 2024-09-21 DIAGNOSIS — Z79890 Hormone replacement therapy (postmenopausal): Secondary | ICD-10-CM

## 2024-09-21 DIAGNOSIS — B009 Herpesviral infection, unspecified: Secondary | ICD-10-CM

## 2024-09-21 DIAGNOSIS — Z01419 Encounter for gynecological examination (general) (routine) without abnormal findings: Secondary | ICD-10-CM

## 2024-09-21 MED ORDER — MEDROXYPROGESTERONE ACETATE 5 MG PO TABS: 5.0000 mg | ORAL_TABLET | Freq: Every day | ORAL | 3 refills | Status: AC

## 2024-09-21 MED ORDER — ESTRADIOL 0.5 MG PO TABS: 0.2500 mg | ORAL_TABLET | Freq: Every day | ORAL | 3 refills | Status: AC

## 2024-09-21 NOTE — Progress Notes (Signed)
 Rachel Shepherd 1951-04-07 [id number]   History:  73 y.o. G0 presents for breast and pelvic exam. Postmenopausal - on HRT for hot flashes and night sweats. Taking 0.25 mg Estradiol  daily, provera  5 mg daily. Has tried to wean but does not tolerate. 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 HSV-1, takes Acyclovir  daily, occasional oral outbreaks. Wondering if she should continue. Very active with yoga, pickle ball, and has a Systems analyst.   Gynecologic History No LMP recorded. Patient is postmenopausal.   Contraception: post menopausal status Sexually active: Yes  Health Maintenance Last Pap: 07/22/2020. Results were: Normal neg HPV Last mammogram: 07/30/2024. Results were: Normal Last colonoscopy: 08/05/2020, 5-year recall Last Dexa: 09/22/2022. Results were: Normal  Past medical history, past surgical history, family history and social history were all reviewed and documented in the EPIC chart. Widowed. Husband passed from liver cancer a couple of years ago. Has toy poodle.   ROS:  A ROS was performed and pertinent positives and negatives are included.  Exam:  Vitals:   09/21/24 1414  BP: 130/60  Pulse: 70  SpO2: 96%  Weight: 121 lb 9.6 oz (55.2 kg)  Height: 5' 1.61 (1.565 m)     Body mass index is 22.52 kg/m.  General appearance:  Normal Thyroid:  Symmetrical, normal in size, without palpable masses or nodularity. Respiratory  Auscultation:  Clear without wheezing or rhonchi Cardiovascular  Auscultation:  Regular rate, without rubs, murmurs or gallops  Edema/varicosities:  Not grossly evident Abdominal  Soft,nontender, without masses, guarding or rebound.  Liver/spleen:  No organomegaly noted  Hernia:  None appreciated  Skin  Inspection:  Grossly normal Breasts: Examined lying and sitting.   Right: Without masses, retractions, nipple discharge or axillary adenopathy.   Left: Without masses, retractions, nipple discharge or axillary adenopathy. Pelvic: External  genitalia:  no lesions              Urethra:  normal appearing urethra with no masses, tenderness or lesions              Bartholins and Skenes: normal                 Vagina: normal appearing vagina with normal color and discharge, no lesions              Cervix: no lesions Bimanual Exam:  Uterus:  no masses or tenderness              Adnexa: no mass, fullness, tenderness              Rectovaginal: Deferred              Anus:  normal, no lesions  Geni Pica, CMA present as chaperone.   Assessment/Plan:  73 y.o. G0 for breast and pelvic exam.   Encounter for breast and pelvic examination - Education provided on SBEs, importance of preventative screenings, current guidelines, high calcium diet, regular exercise, and multivitamin daily. Labs with PCP.   Postmenopausal hormone therapy - Plan: estradiol  (ESTRACE ) 0.5 MG tablet (0.25 mg, 1/2 tablet) daily, medroxyPROGESTERone  (PROVERA ) 5 MG tablet daily. Has tried to wean in the past but does not tolerate. Refill x 1 year provided.   HSV infection - Has been taking Acyclovir  1 tablet daily. Has occasional oral outbreak. GFR down after UTI recently, trending back up. 54 on 10/2. Discussed acyclovir  on kidney function. If GFR above 60 next check can continue daily, otherwise would take as needed.   Screening for cervical cancer - 2011 CIN-1,  subsequent paps normal. No longer screening per guidelines.   Screening for breast cancer - Normal mammogram history.  Continue annual screenings.  Normal breast exam today.  Screening for colon cancer - 2021 colonoscopy. Will repeat at 5-year interval per GI's recommendation.   Screening for osteoporosis - Normal bone density in August 2023.   Return in about 1 year (around 09/21/2025) for B&P (high risk).     Rachel DELENA Shutter DNP, 2:48 PM 09/21/2024

## 2024-10-04 ENCOUNTER — Encounter: Admitting: Nurse Practitioner

## 2024-10-24 ENCOUNTER — Ambulatory Visit (INDEPENDENT_AMBULATORY_CARE_PROVIDER_SITE_OTHER): Admitting: Otolaryngology

## 2024-10-24 VITALS — BP 148/62 | HR 70 | Temp 98.1°F | Ht 62.0 in | Wt 116.0 lb

## 2024-10-24 DIAGNOSIS — J309 Allergic rhinitis, unspecified: Secondary | ICD-10-CM | POA: Diagnosis not present

## 2024-10-24 DIAGNOSIS — H6983 Other specified disorders of Eustachian tube, bilateral: Secondary | ICD-10-CM | POA: Diagnosis not present

## 2024-10-24 DIAGNOSIS — H6123 Impacted cerumen, bilateral: Secondary | ICD-10-CM | POA: Insufficient documentation

## 2024-10-24 DIAGNOSIS — J310 Chronic rhinitis: Secondary | ICD-10-CM | POA: Insufficient documentation

## 2024-10-24 NOTE — Progress Notes (Signed)
 Patient ID: Rachel Shepherd, female   DOB: 28-Jun-1951, 73 y.o.   MRN: [id number]  Follow up: Bilateral eustachian tube dysfunction  Discussed the use of AI scribe software for clinical note transcription with the patient, who gave verbal consent to proceed.  History of Present Illness Rachel Shepherd is a 73 year old female who presents today complaining of clogging sensation in her ears.  She experiences a sensation of ear clogging similar to what one might feel on an airplane. This sensation had resolved previously but has returned in the last few weeks. She recalls having this issue during her last visit two years ago, which was treated with Flonase nasal spray.  She is able to equalize the pressure in her ears by performing a Valsalva maneuver. She inquires if the change in weather could be contributing to her symptoms.  She mentions that her nose runs frequently, which she attributes to allergies or weather changes, but does not report any swelling.  Exam: General: Communicates without difficulty, well nourished, no acute distress. Head: Normocephalic, no evidence injury, no tenderness, facial buttresses intact without stepoff. Face/sinus: No tenderness to palpation and percussion. Facial movement is normal and symmetric. Eyes: PERRL, EOMI. No scleral icterus, conjunctivae clear. Neuro: CN II exam reveals vision grossly intact.  No nystagmus at any point of gaze. Ears: Auricles well formed without lesions.  Bilateral cerumen impaction.  Nose: External evaluation reveals normal support and skin without lesions.  Dorsum is intact.  Anterior rhinoscopy reveals congested mucosa over anterior aspect of inferior turbinates and intact septum.  No purulence noted. Oral:  Oral cavity and oropharynx are intact, symmetric, without erythema or edema.  Mucosa is moist without lesions. Neck: Full range of motion without pain.  There is no significant lymphadenopathy.  No masses palpable.  Thyroid bed within normal  limits to palpation.  Parotid glands and submandibular glands equal bilaterally without mass.  Trachea is midline. Neuro:  CN 2-12 grossly intact.   Procedure: Bilateral cerumen disimpaction Anesthesia: None Description: Under the operating microscope, the cerumen is carefully removed with a combination of cerumen currette, alligator forceps, and suction catheters.  After the cerumen is removed, the TMs are noted to be normal.  No mass, erythema, or lesions. The patient tolerated the procedure well.     Assessment and Plan Assessment & Plan Eustachian tube dysfunction Intermittent Eustachian tube dysfunction with recent recurrence of symptoms, including clogging sensation in the ears, particularly during air travel. Symptoms improved spontaneously previously. No current use of nasal spray.  - Perform Valsalva maneuver 20-30 times daily. - Use Flonase nasal spray, two sprays in each nostril once daily until symptoms resolve.  Bilateral impacted cerumen Significant cerumen impaction, more pronounced on the right side. Cerumen removal performed during the visit.  - Advised against using Q-tips inside the ear canal.  Allergic rhinitis Chronic runny nose likely due to allergies, possibly exacerbated by seasonal changes. No acute intervention required. - Continue to monitor symptoms and manage conservatively.

## 2024-11-13 ENCOUNTER — Encounter: Payer: Self-pay | Admitting: Nurse Practitioner

## 2024-11-13 ENCOUNTER — Other Ambulatory Visit: Payer: Self-pay | Admitting: Nurse Practitioner

## 2024-11-13 DIAGNOSIS — N958 Other specified menopausal and perimenopausal disorders: Secondary | ICD-10-CM

## 2024-11-13 MED ORDER — ESTRADIOL 0.01 % VA CREA: 1.0000 g | TOPICAL_CREAM | VAGINAL | 1 refills | Status: DC

## 2024-11-21 ENCOUNTER — Encounter: Payer: Self-pay | Admitting: Nurse Practitioner

## 2024-11-23 ENCOUNTER — Encounter: Payer: Self-pay | Admitting: Family Medicine

## 2024-11-23 ENCOUNTER — Ambulatory Visit: Admitting: Family Medicine

## 2024-11-23 VITALS — BP 134/78 | HR 72 | Temp 98.2°F | Ht 62.0 in | Wt 121.8 lb

## 2024-11-23 DIAGNOSIS — I10 Essential (primary) hypertension: Secondary | ICD-10-CM | POA: Diagnosis not present

## 2024-11-23 DIAGNOSIS — F418 Other specified anxiety disorders: Secondary | ICD-10-CM | POA: Diagnosis not present

## 2024-11-23 DIAGNOSIS — N1831 Chronic kidney disease, stage 3a: Secondary | ICD-10-CM | POA: Diagnosis not present

## 2024-11-23 DIAGNOSIS — M75101 Unspecified rotator cuff tear or rupture of right shoulder, not specified as traumatic: Secondary | ICD-10-CM | POA: Insufficient documentation

## 2024-11-23 DIAGNOSIS — N951 Menopausal and female climacteric states: Secondary | ICD-10-CM | POA: Diagnosis not present

## 2024-11-23 DIAGNOSIS — Z23 Encounter for immunization: Secondary | ICD-10-CM | POA: Diagnosis not present

## 2024-11-23 DIAGNOSIS — J309 Allergic rhinitis, unspecified: Secondary | ICD-10-CM | POA: Insufficient documentation

## 2024-11-23 NOTE — Assessment & Plan Note (Signed)
 Rachel Shepherd initial BP was quite high today, but is only mildly high on repeat. I recommend she continue to monitor this at home. If it remains high on follow-up, I would consider addition of an antihypertensive in light of her CKD.

## 2024-11-23 NOTE — Assessment & Plan Note (Signed)
 Stable. Continue to follow with GYN and continue estradiol  0.5 mg 1/2 tab daily and medroxyprogesterone  5 mg daily.

## 2024-11-23 NOTE — Assessment & Plan Note (Signed)
 Stable. Continue lamotrigine 25 mg daily.

## 2024-11-23 NOTE — Assessment & Plan Note (Signed)
 Shared previous lab findings. Focus on blood pressure control, adequate hydration, and avoidance of nephrotoxic medications.

## 2024-11-23 NOTE — Progress Notes (Signed)
 [facility] [address] [place] [address] Dept: [phone] Dept Fax: [phone]  New Patient Office Visit  Subjective:    Patient ID: Rachel Shepherd, female    DOB: 02-15-1951, 73 y.o..   MRN: [id number]  Chief Complaint  Patient presents with   Establish Care    NP- establish care.     History of Present Illness:  Patient is in today to establish care. Rachel Shepherd was born in [place], [place]. She attended [employer] in health and physical education. She met her husband in college. They moved to [place] soon after. They were married for 40 years (he is now deceased). They had no children. 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 She has two step-children and 3 step-grandchildren. She denies tobacco or drug use. She drinks socially.  Rachel Shepherd has a history of hypertension. She classifies this as her blood pressure sometimes being high. She showed me a log from home. Although some days her BP is normal, others show a significantly elevated BP in the 170s systolic.  Rachel Shepherd has a history of vasomotor symptoms related to menopause. She is managed on estradiol  0.5 mg 1/2 tab daily and medroxyprogesterone  5 mg daily. She also uses a vaginal estradiol  cream, which she notes is for UTI prevention.  Rachel Shepherd has a history of anxiety and depression that was quite severe at one point in the past. He previous provider started her on lamotrigine. She notes this evened her mood and has worked very well for her.  Past Medical History: Patient Active Problem List   Diagnosis Date Noted   Prediabetes 09/04/2024   Essential hypertension 02/28/2024   Elevated blood pressure reading 02/07/2024   DDD (degenerative disc disease), cervical 10/19/2021   HSV-2 (herpes simplex virus 2) infection    Dysplasia of cervix, low grade (CIN 1)    High risk HPV infection    History of colonic polyps 07/09/2009    Past Surgical History:  Procedure Laterality Date   COLPOSCOPY     CYST REMOVED RIGHT CHEEK     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  2009   TUBAL LIGATION     WRIST SURGERY     Family History  Problem Relation Age of Onset   Heart disease Mother        PACE MAKER   Breast cancer Mother        Age 75   Stroke Paternal Grandmother    Heart disease Paternal Grandmother    Liver disease Neg Hx    Colon cancer Neg Hx    Esophageal cancer Neg Hx    Inflammatory bowel disease Neg Hx    Pancreatic cancer Neg Hx    Rectal cancer Neg Hx    Stomach cancer Neg Hx    Outpatient Medications Prior to Visit  Medication Sig Dispense Refill   acyclovir  (ZOVIRAX ) 200 MG capsule TAKE 1 CAPSULE(200 MG) BY MOUTH TWICE DAILY (Patient not taking: Reported on 10/24/2024) 180 capsule 1   estradiol  (ESTRACE ) 0.01 % CREA vaginal cream Place 1 g vaginally 2 (two) times a week. Initial dose: nightly x 2 weeks, then twice weekly 42.5 g 1   estradiol  (ESTRACE ) 0.5 MG tablet Take 0.5 tablets (0.25 mg total) by mouth daily. 90 tablet 3   lamoTRIgine (LAMICTAL) 25 MG tablet TAKE 1 TABLET BY MOUTH DAILY X 7 DAYS THEN 2 TABLETS X 7 DAYS THEN 3 TABLETS X 7 DAYS THEN 4  TABLETS DAILY     levocetirizine (XYZAL) 5 MG tablet Take 5 mg by mouth every evening.     medroxyPROGESTERone  (PROVERA ) 5 MG tablet Take 1 tablet (5 mg total) by mouth daily. 90 tablet 3   MAGNESIUM PO Take 1 tablet by mouth daily.     No facility-administered medications prior to visit.   Allergies[1] Objective:   Today's Vitals   11/23/24 1459 11/23/24 1610  BP: (!) 160/88 134/78  Pulse: 72   Temp: 98.2 F (36.8 C)   TempSrc: Temporal   SpO2: 98%   Weight: 121 lb 12.8 oz (55.2 kg)   Height: 5' 2 (1.575 m)    Body mass index is 22.28 kg/m.   General: Well developed, well nourished. No acute distress. Psych: Alert and oriented. Normal mood and affect.  Health Maintenance Due  Topic Date Due   Hepatitis C Screening  Never done    Colonoscopy  04/11/2021   Cervical Cancer Screening (Pap smear)  07/22/2021   Medicare Annual Wellness (AWV)  05/17/2024   Influenza Vaccine  07/13/2024   COVID-19 Vaccine (4 - 2025-26 season) 08/13/2024   Lab Results: Component Ref Range & Units 2 mo ago  Na 136 - 146 mmol/L 135 Low   Potassium 3.7 - 5.4 mmol/L 4.0  Cl 97 - 108 mmol/L 96 Low   CO2 20 - 32 mmol/L 23  AGAP 7 - 16 mmol/L 16  Glucose 65 - 99 mg/dL 892 High   BUN 8 - 27 mg/dL 15  Creatinine 9.42 - 8.99 mg/dL 8.92 High   Ca 8.6 - 89.7 mg/dL 8.4 Low   ALK PHOS 25 - 165 U/L 169 High   T Bili 0.0 - 1.2 mg/dL 0.3  Total Protein 6.0 - 8.5 gm/dL 6.3  Alb 3.5 - 4.8 gm/dL 3.5  GLOBULIN 1.5 - 4.5 gm/dL 2.8  ALBUMIN/GLOBULIN RATIO 1.1 - 2.5 1.3  BUN/CREAT RATIO 11.0 - 26.0 14.0  ALT 0 - 40 U/L 48 High   AST 0 - 40 U/L 47 High   eGFR >=60 mL/min/1.61m2 54 Low    Component Ref Range & Units 2 mo ago  WBC 4.0 - 10.5 thou/mcL 10.8 High   RBC 3.93 - 5.22 million/mcL 4.02  HGB 11.2 - 15.7 gm/dL 88.7  HCT 65.8 - 55.0 % 32.7 Low   MCV 79.4 - 94.8 fL 81.3  MCH 25.6 - 32.2 pg 27.9  MCHC 32.2 - 35.5 gm/dL 65.6  Plt Ct 849 - 599 thou/mcL 215  RDW SD 35.1 - 46.3 fL 41.1  MPV 9.4 - 12.4 fL 9.7  NRBC% 0.0 - 0.2 /100WBC 0.0  Absolute NRBC Count 0.00 - 0.01 thou/mcL 0.00  NEUTROPHIL % % 71.4  LYMPHOCYTE % % 14.3  MONOCYTE % % 10.7  Eosinophil % % 1.3  BASOPHIL % % 0.5  IG% % 1.8  ABSOLUTE NEUTROPHIL COUNT 1.50 - 7.50 thou/mcL 7.68 High   ABSOLUTE LYMPHOCYTE COUNT 1.00 - 4.50 thou/mcL 1.54  Absolute Monocyte Count 0.10 - 0.80 thou/mcL 1.15 High   Absolute Eosinophil Count 0.00 - 0.50 thou/mcL 0.14  Absolute Basophil Count 0.00 - 0.20 thou/mcL 0.05  Absolute Immature Granulocyte Count 0.00 - 0.03 thou/mcL 0.19 High    Component Ref Range & Units 6 mo ago  Cholesterol, Total 100 - 199 mg/dL 807  Triglycerides 0 - 149 mg/dL 55  HDL >60 mg/dL 74  VLDL Cholesterol Cal 5 - 40 mg/dL  10  LDL 0 - 99 mg/dL 891 High  Component Ref Range & Units 3 mo ago Comments  Hemoglobin A1c 4.8 - 5.6 % 6.1 High           Prediabetes: 5.7 - 6.4          Diabetes: >6.4          Glycemic control for adults with diabetes: <7.0      Assessment & Plan:   Problem List Items Addressed This Visit       Cardiovascular and Mediastinum   Essential hypertension - Primary   Rachel Shepherd's initial BP was quite high today, but is only mildly high on repeat. I recommend she continue to monitor this at home. If it remains high on follow-up, I would consider addition of an antihypertensive in light of her CKD.        Genitourinary   Chronic kidney disease, stage 3a (HCC)   Shared previous lab findings. Focus on blood pressure control, adequate hydration, and avoidance of nephrotoxic medications.         Other   Anxiety with depression   Stable. Continue lamotrigine 25 mg daily.      Vasomotor symptoms due to menopause   Stable. Continue to follow with GYN and continue estradiol  0.5 mg 1/2 tab daily and medroxyprogesterone  5 mg daily.      Other Visit Diagnoses       Need for immunization against influenza       Relevant Orders   Flu vaccine HIGH DOSE PF(Fluzone Trivalent) (Completed)       Return in about 3 months (around 02/21/2025) for Reassessment.   Rachel CHRISTELLA Simpler, MD  I,Emily Lagle,acting as a scribe for Rachel CHRISTELLA Simpler, MD.,have documented all relevant documentation on the behalf of Rachel CHRISTELLA Simpler, MD.  I, Rachel CHRISTELLA Simpler, MD, have reviewed all documentation for this visit. The documentation on 11/23/2024 for the exam, diagnosis, procedures, and orders are all accurate and complete.     [1]  Allergies Allergen Reactions   Amoxicillin     REACTION: rash   Clindamycin     REACTION: VAGINITIS,FEVER BLISTER,STOMACH ACHES   Doxycycline     REACTION: rash/itchy

## 2024-12-25 ENCOUNTER — Encounter: Payer: Self-pay | Admitting: Nurse Practitioner

## 2024-12-25 NOTE — Telephone Encounter (Signed)
 Tiffany -please review and advise. I will be happy to assist with scheduling if needed.

## 2024-12-26 ENCOUNTER — Ambulatory Visit: Admitting: Nurse Practitioner

## 2024-12-26 ENCOUNTER — Encounter: Payer: Self-pay | Admitting: Nurse Practitioner

## 2024-12-26 VITALS — BP 120/72 | HR 80

## 2024-12-26 DIAGNOSIS — B009 Herpesviral infection, unspecified: Secondary | ICD-10-CM

## 2024-12-26 MED ORDER — VALACYCLOVIR HCL 500 MG PO TABS: 500.0000 mg | ORAL_TABLET | Freq: Every day | ORAL | 1 refills | Status: AC

## 2024-12-26 NOTE — Progress Notes (Signed)
" ° °  Acute Office Visit  Subjective:    Patient ID: Rachel Shepherd, female    DOB: 03-18-1951, 74 y.o.   MRN: [id number]   HPI 74 y.o. presents today for possible HSV outbreak. H/O HSV 2. Wants to make sure lesions are consistent with HSV before treating. Had recent decline in kidney function, so it was recommended she stop daily acyclovir . Has taken Acyclovir  400 mg TID since yesterday. GFR 60 yesterday. Has partner.   No LMP recorded. Patient is postmenopausal.    Review of Systems  Constitutional: Negative.   Genitourinary:  Positive for genital sores.       Objective:    Physical Exam Exam conducted with a chaperone present.  Constitutional:      Appearance: Normal appearance.  Genitourinary:     BP 120/72   Pulse 80   SpO2 98%  Wt Readings from Last 3 Encounters:  11/23/24 121 lb 12.8 oz (55.2 kg)  10/24/24 116 lb (52.6 kg)  09/21/24 121 lb 9.6 oz (55.2 kg)        Zada Louder, CMA present as chaperone.   Assessment & Plan:   Problem List Items Addressed This Visit       Other   HSV-2 (herpes simplex virus 2) infection - Primary   Relevant Medications   valACYclovir  (VALTREX ) 500 MG tablet   Plan: Vulvar lesion consistent with HSV outbreak. Has leftover Acyclovir  at home - take 1 tablet (200 mg) 5 times per day for 3 more days. Can then take daily Valtrex  500 mg for suppression. Increase to twice daily with outbreak. Discussed importance of monitoring kidney function. Also educated on transmission and preventing this with condoms and suppressive treatment.   Return if symptoms worsen or fail to improve.    [name] DNP, 2:25 PM 12/26/2024 "

## 2025-01-16 ENCOUNTER — Ambulatory Visit (INDEPENDENT_AMBULATORY_CARE_PROVIDER_SITE_OTHER): Admitting: Otolaryngology

## 2025-01-22 ENCOUNTER — Ambulatory Visit (INDEPENDENT_AMBULATORY_CARE_PROVIDER_SITE_OTHER): Admitting: Otolaryngology

## 2025-01-23 ENCOUNTER — Ambulatory Visit (INDEPENDENT_AMBULATORY_CARE_PROVIDER_SITE_OTHER): Admitting: Otolaryngology

## 2025-02-11 ENCOUNTER — Ambulatory Visit

## 2025-02-22 ENCOUNTER — Ambulatory Visit: Admitting: Family Medicine
